# Patient Record
Sex: Female | Born: 1945 | Race: White | Hispanic: No | Smoking: Current every day smoker
Health system: Southern US, Community
[De-identification: ages and names within clinical notes are randomized; demographics above are authoritative.]

## PROBLEM LIST (undated history)

## (undated) DIAGNOSIS — I252 Old myocardial infarction: Secondary | ICD-10-CM

## (undated) DIAGNOSIS — R0989 Other specified symptoms and signs involving the circulatory and respiratory systems: Secondary | ICD-10-CM

## (undated) DIAGNOSIS — K219 Gastro-esophageal reflux disease without esophagitis: Secondary | ICD-10-CM

## (undated) DIAGNOSIS — F411 Generalized anxiety disorder: Secondary | ICD-10-CM

## (undated) DIAGNOSIS — I350 Nonrheumatic aortic (valve) stenosis: Secondary | ICD-10-CM

## (undated) DIAGNOSIS — E782 Mixed hyperlipidemia: Secondary | ICD-10-CM

## (undated) DIAGNOSIS — Z955 Presence of coronary angioplasty implant and graft: Secondary | ICD-10-CM

## (undated) DIAGNOSIS — I10 Essential (primary) hypertension: Secondary | ICD-10-CM

## (undated) DIAGNOSIS — I2510 Atherosclerotic heart disease of native coronary artery without angina pectoris: Secondary | ICD-10-CM

## (undated) HISTORY — PX: LAPAROSCOPIC OOPHERECTOMY: SHX6507

## (undated) HISTORY — DX: Other specified symptoms and signs involving the circulatory and respiratory systems: R09.89

## (undated) HISTORY — DX: Generalized anxiety disorder: F41.1

## (undated) HISTORY — DX: Mixed hyperlipidemia: E78.2

## (undated) HISTORY — DX: Nonrheumatic aortic (valve) stenosis: I35.0

## (undated) HISTORY — DX: Atherosclerotic heart disease of native coronary artery without angina pectoris: I25.2

## (undated) HISTORY — DX: Gastro-esophageal reflux disease without esophagitis: K21.9

## (undated) HISTORY — DX: Atherosclerotic heart disease of native coronary artery without angina pectoris: I25.10

## (undated) HISTORY — DX: Essential (primary) hypertension: I10

## (undated) HISTORY — PX: PERCUTANEOUS CORONARY STENT INTERVENTION (PCI-S): SHX6016

## (undated) HISTORY — DX: Presence of coronary angioplasty implant and graft: Z95.5

---

## 1986-09-29 HISTORY — PX: ABDOMINAL HYSTERECTOMY: SHX81

## 1990-09-29 HISTORY — PX: LAPAROSCOPIC GASTROTOMY W/ REPAIR OF ULCER: SUR772

## 1995-09-30 DIAGNOSIS — I219 Acute myocardial infarction, unspecified: Secondary | ICD-10-CM

## 1995-09-30 HISTORY — DX: Acute myocardial infarction, unspecified: I21.9

## 1998-02-07 ENCOUNTER — Ambulatory Visit (HOSPITAL_COMMUNITY): Admission: RE | Admit: 1998-02-07 | Discharge: 1998-02-07 | Payer: Self-pay | Admitting: Cardiology

## 1999-10-01 ENCOUNTER — Encounter: Admission: RE | Admit: 1999-10-01 | Discharge: 1999-10-01 | Payer: Self-pay | Admitting: Cardiology

## 1999-10-01 ENCOUNTER — Encounter: Payer: Self-pay | Admitting: Cardiology

## 1999-10-11 ENCOUNTER — Ambulatory Visit (HOSPITAL_COMMUNITY): Admission: RE | Admit: 1999-10-11 | Discharge: 1999-10-12 | Payer: Self-pay | Admitting: Cardiology

## 2002-06-09 ENCOUNTER — Encounter: Payer: Self-pay | Admitting: Specialist

## 2002-06-09 ENCOUNTER — Ambulatory Visit (HOSPITAL_COMMUNITY): Admission: RE | Admit: 2002-06-09 | Discharge: 2002-06-09 | Payer: Self-pay | Admitting: Specialist

## 2006-08-26 ENCOUNTER — Encounter (HOSPITAL_COMMUNITY): Admission: RE | Admit: 2006-08-26 | Discharge: 2006-08-27 | Payer: Self-pay | Admitting: Cardiology

## 2006-09-11 ENCOUNTER — Ambulatory Visit (HOSPITAL_COMMUNITY): Admission: RE | Admit: 2006-09-11 | Discharge: 2006-09-11 | Payer: Self-pay | Admitting: Cardiology

## 2006-09-11 ENCOUNTER — Ambulatory Visit: Admission: RE | Admit: 2006-09-11 | Discharge: 2006-09-11 | Payer: Self-pay | Admitting: Cardiology

## 2008-11-24 ENCOUNTER — Emergency Department (HOSPITAL_COMMUNITY): Admission: EM | Admit: 2008-11-24 | Discharge: 2008-11-25 | Payer: Self-pay | Admitting: Emergency Medicine

## 2009-09-17 ENCOUNTER — Ambulatory Visit (HOSPITAL_COMMUNITY): Admission: RE | Admit: 2009-09-17 | Discharge: 2009-09-17 | Payer: Self-pay | Admitting: Otolaryngology

## 2010-12-30 LAB — CBC
HCT: 43.4 % (ref 36.0–46.0)
MCHC: 34.2 g/dL (ref 30.0–36.0)
MCV: 94 fL (ref 78.0–100.0)
Platelets: 317 10*3/uL (ref 150–400)
RDW: 14.4 % (ref 11.5–15.5)

## 2011-01-14 LAB — URINALYSIS, ROUTINE W REFLEX MICROSCOPIC
Bilirubin Urine: NEGATIVE
Ketones, ur: NEGATIVE mg/dL
Nitrite: NEGATIVE
Protein, ur: NEGATIVE mg/dL
Urobilinogen, UA: 0.2 mg/dL (ref 0.0–1.0)

## 2011-01-14 LAB — POCT I-STAT, CHEM 8
BUN: 15 mg/dL (ref 6–23)
Calcium, Ion: 1.09 mmol/L — ABNORMAL LOW (ref 1.12–1.32)
Chloride: 106 meq/L (ref 96–112)
Creatinine, Ser: 0.8 mg/dL (ref 0.4–1.2)
Glucose, Bld: 107 mg/dL — ABNORMAL HIGH (ref 70–99)
HCT: 39 % (ref 36.0–46.0)
Hemoglobin: 13.3 g/dL (ref 12.0–15.0)
Potassium: 3.6 meq/L (ref 3.5–5.1)
Sodium: 140 meq/L (ref 135–145)
TCO2: 25 mmol/L (ref 0–100)

## 2011-01-14 LAB — URINE MICROSCOPIC-ADD ON

## 2011-01-14 LAB — CARBOXYHEMOGLOBIN
Carboxyhemoglobin: 5.9 % (ref 0.5–1.5)
O2 Saturation: 93.8 %
Total hemoglobin: 13 g/dL (ref 12.5–16.0)
Total oxygen content: 15.8 mL/dL (ref 15.0–23.0)

## 2011-02-14 NOTE — Cardiovascular Report (Signed)
NAMEROCHEL, PRIVETT NO.:  [id number]   MEDICAL RECORD NO.:  [id number]          PATIENT TYPE:  OIB   LOCATION:  2899                         FACILITY:  [facility]   PHYSICIAN:  Madaline Savage, M.D.DATE OF BIRTH:  11/05/1945   DATE OF PROCEDURE:  09/11/2006  DATE OF DISCHARGE:  09/11/2006                            CARDIAC CATHETERIZATION   __________  Ostial diagonal branch and also in mid-LAD.  There is an abrupt change  in vessel diameter between the proximal LAD and the mid-LAD, but no  lesions are noted.  Both stents are widely patent in the mid-LAD and in  the ostial diagonal and proximal diagonal stents.   The circumflex coronary artery is dominant.  It contains a radio-opaque  stent midway down the vessel and it is widely patent at the stent site  with good distal runoff.  No new lesions are seen in the circumflex,  which anatomically consists of a first obtuse marginal branch followed  by two posterolateral branches more distally.   Right coronary artery is a small and nondominant vessel, which had  previously had angioplasty and I see no residual stenosis in the  proximal RCA.  The left ventricular angiogram shows good contractility  of all wall segments with ejection fraction estimate of 60%.  No mitral  regurgitation or LV thrombus noted.  Abdominal aortography was performed  because the patient is hypertensive.  Both renal arteries are single and  normal.  No abdominal aortic pathology is seen.   FINAL IMPRESSIONS:  1. Recent chest pain and abnormal stress test showing anteroapical      ischemia.  2. Normal left ventricular systolic function.  3. Coronary artery disease status post multivessel stenting.      a.     Patent mid-left anterior descending stent deployed 1996,       which is a Cook stent.      b.     Patent T-stent to diagonal interfacing with the mid-left       anterior descending stent in a T-like fashion, widely patent with  good distal runoff.      c.     Patent distal circumflex stent.      d.     Patent angioplasty site to the proximal right circumflex       artery.      e.     Normal left ventricular systolic function.  4. Normal renal arteries.  5. Normal abdominal aorta.   PLAN:  The patient will be reassured.  She will be a candidate for  discharge in approximately four hours and will be followed up either in  [place] or in [place] for followup.           ______________________________  Madaline Savage, M.D.     WHG/MEDQ  D:  09/11/2006  T:  09/12/2006  Job:  [id number]   cc:   Madelin Rear. Sherwood Gambler, MD  [facility]  [facility] Medical Records

## 2011-02-14 NOTE — Cardiovascular Report (Signed)
[place]  Patient:    Jenna Allen                       MRN: [id number] Proc. Date: 10/11/99 Adm. Date:  [id number] Attending:  Ophelia Shoulder CC:         Cardiac Catheterization Laboratory             Madaline Savage, M.D.                        Cardiac Catheterization  PROCEDURES PERFORMED: 1. Intracoronary artery stenting of the proximal posterior descending branch of the    left circumflex dominant coronary artery. 2. Percutaneous transluminal coronary angioplasty of the proximal nondominant right    coronary artery. 3. Selective coronary angiography by Judkins technique for retrograde left heart    catheterization. 4. Left ventricular angiography.  COMPLICATIONS:  None.  ENTRY SITE:  Right femoral.  DYE USED:  Omnipaque for diagnostic catheterization and Hexabrix for interventional procedures.  MEDICATIONS GIVEN:  Versed, Fentanyl, ReoPro, and heparin.  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  She had a complex interventional procedure involving the proximal and mid LAD and the ostium of a right diagonal branch. ll three sites were stented in a "Y" configuration with subsequently negative Cardiolite studies.  The patient has high cholesterol, obesity, smokes, and has  recently had some chest pain.  Todays outpatient catheterization is performed electively.  RESULTS:  PRESSURES:  The left ventricular pressure was 125/23, central aortic pressure 125/75 with a mean of 95.  No aortic valve gradient was noted.  ANGIOGRAPHIC RESULTS: 1. The left main coronary artery is normal.  There is calcification involving the    mid segment of the circumflex, which is a dominant vessel, and also in the    proximal LAD.  2. The left anterior descending artery at the complex stent site is remarkably    good-looking.  There is some ectasia in  the proximal LAD at its stent site, ut    there is wide patency of the area.  The mid LAD, likewise, is widely patent.  The ostial portion of the first diagonal is also patent with at most a 20%    ostial stenosis.  The distal runoff in both the diagonal and the LAD is    excellent.  3. The left circumflex is a dominant vessel.  The first obtuse marginal branch    bifurcates and is tortuous and is normal.  The mid portion of the circumflex  contains 40% to 50% luminal irregularities of a diffuse nature.  The proximal    posterior descending branch of the circumflex contains a 95% discrete lesion    and also a short segmental stenosis approaching 75%.  Distal runoff in this    vessel is fairly good.  4. The right coronary artery is small and nondominant.  There is a 99% stenosis in    the proximal portion of the vessel just before an acute marginal branch.  LEFT VENTRICULOGRAPHY:  The left ventricle shows anterolateral wall hypokinesis of a mild degree.  Ejection fraction is estimated at 50% to 55%.  No mitral regurgitation or LV thrombus is seen.  INTERVENTIONAL PROCEDURES:  PROCEDURE #1:  This procedure was a stent procedure with predilatation with balloon angioplasty to the proximal portion of the PDA.  It was  accomplished using a 7-French Voda 3.5 guide catheter, a Patriot wire, and a predilatation balloon which was a 2.5 x 15 mm CrossSail balloon.  The stent used was a 2.5 x 13 mm Tetra stent inflated to 12 atm corresponding to a transluminal diameter of approximately 2.7. The result was excellent with reduction of that 90% stenosis to 0% with TIMI-3 class distal flow.  PROCEDURE #2:  This intervention involved the proximal nondominant right coronary artery.  It was 99% stenosed.  We used a 6-French guide catheter with sideholes in a right Judkins configuration.  A Patriot wire was used.  I dilated first with  1.5 mm x 20 mm CrossSail balloon and inflated to 8 atm of  pressure.  There was  residual stenosis of approximately 60%.  The second balloon used was a 2.0 x 20 mm Adante balloon.  With this, I was able to dilate up to 3 atm to 4 atm safely, corresponding to an estimated transluminal diameter of 1.9.  There was a 40% residual stenosis.  Because the lesion appeared elastic, I was afraid to more aggressively dilate for fear of causing dissection, so I stopped at that point.  The 99% stenosis was reduced to 40%.  There was TIMI-3 class distal flow.  FINAL DIAGNOSES: 1. Multivessel coronary artery disease.      a. Status post mid and proximal left anterior descending artery stenting with         patency of stents.      b. Ostial diagonal stenosis, 20% at stent site.      c. Right coronary artery, proximal 99%.      d. Mid circumflex 50% stenosis.      e. There was 90% proximal posterior descending branch stenosis of dominant         left circumflex coronary artery. 2. Well preserved left ventricular function status post anterior myocardial    infarction. 3. Successful stenting of the proximal posterior descending artery with reduction    of a 90% lesion to 0%. 4. Successful percutaneous transluminal coronary angioplasty of the proximal right    coronary artery with reduction of a 99% stenosis to 40%.  PLAN:  Continue medical therapy.  Continue antilipid therapy.  Continue attempts to reduce weight and to stop smoking. DD:  10/11/99 TD:  10/11/99 Job: [id number]

## 2012-07-21 ENCOUNTER — Other Ambulatory Visit (HOSPITAL_COMMUNITY): Payer: Self-pay | Admitting: Family Medicine

## 2012-07-21 DIAGNOSIS — Z139 Encounter for screening, unspecified: Secondary | ICD-10-CM

## 2012-07-27 ENCOUNTER — Ambulatory Visit (HOSPITAL_COMMUNITY)
Admission: RE | Admit: 2012-07-27 | Discharge: 2012-07-27 | Disposition: A | Discharge status: Home / Self Care | Payer: Medicare Other | Source: Ambulatory Visit | Attending: Family Medicine | Admitting: Family Medicine

## 2012-07-27 ENCOUNTER — Ambulatory Visit (HOSPITAL_COMMUNITY): Payer: Self-pay

## 2012-07-27 DIAGNOSIS — Z1231 Encounter for screening mammogram for malignant neoplasm of breast: Secondary | ICD-10-CM | POA: Insufficient documentation

## 2012-07-27 DIAGNOSIS — Z139 Encounter for screening, unspecified: Secondary | ICD-10-CM

## 2013-04-15 ENCOUNTER — Other Ambulatory Visit (HOSPITAL_COMMUNITY): Payer: Self-pay | Admitting: Nurse Practitioner

## 2013-04-15 DIAGNOSIS — M810 Age-related osteoporosis without current pathological fracture: Secondary | ICD-10-CM

## 2013-04-20 ENCOUNTER — Other Ambulatory Visit (HOSPITAL_COMMUNITY): Payer: Medicare Other

## 2013-04-21 ENCOUNTER — Ambulatory Visit (HOSPITAL_COMMUNITY)
Admission: RE | Admit: 2013-04-21 | Discharge: 2013-04-21 | Disposition: A | Discharge status: Home / Self Care | Payer: Medicare Other | Source: Ambulatory Visit | Attending: Nurse Practitioner | Admitting: Nurse Practitioner

## 2013-04-21 DIAGNOSIS — M949 Disorder of cartilage, unspecified: Secondary | ICD-10-CM | POA: Insufficient documentation

## 2013-04-21 DIAGNOSIS — M810 Age-related osteoporosis without current pathological fracture: Secondary | ICD-10-CM

## 2013-04-21 DIAGNOSIS — M899 Disorder of bone, unspecified: Secondary | ICD-10-CM | POA: Insufficient documentation

## 2013-05-02 ENCOUNTER — Other Ambulatory Visit: Payer: Self-pay | Admitting: Cardiology

## 2013-05-03 NOTE — Telephone Encounter (Signed)
Rx was sent to pharmacy electronically. 

## 2013-06-03 ENCOUNTER — Other Ambulatory Visit: Payer: Self-pay | Admitting: Cardiology

## 2013-06-07 NOTE — Telephone Encounter (Signed)
Rx was sent to pharmacy electronically. 

## 2013-08-04 ENCOUNTER — Other Ambulatory Visit (HOSPITAL_COMMUNITY): Payer: Self-pay | Admitting: Physician Assistant

## 2013-08-04 DIAGNOSIS — Z139 Encounter for screening, unspecified: Secondary | ICD-10-CM

## 2013-08-11 ENCOUNTER — Ambulatory Visit (HOSPITAL_COMMUNITY)
Admission: RE | Admit: 2013-08-11 | Discharge: 2013-08-11 | Disposition: A | Discharge status: Home / Self Care | Payer: Medicare Other | Source: Ambulatory Visit | Attending: Physician Assistant | Admitting: Physician Assistant

## 2013-08-11 ENCOUNTER — Ambulatory Visit (HOSPITAL_COMMUNITY): Payer: Medicare Other

## 2013-08-11 DIAGNOSIS — Z139 Encounter for screening, unspecified: Secondary | ICD-10-CM

## 2013-08-11 DIAGNOSIS — Z1231 Encounter for screening mammogram for malignant neoplasm of breast: Secondary | ICD-10-CM | POA: Insufficient documentation

## 2014-12-18 ENCOUNTER — Other Ambulatory Visit (HOSPITAL_COMMUNITY): Payer: Self-pay | Admitting: Physician Assistant

## 2014-12-18 DIAGNOSIS — Z1239 Encounter for other screening for malignant neoplasm of breast: Secondary | ICD-10-CM

## 2014-12-20 ENCOUNTER — Ambulatory Visit (HOSPITAL_COMMUNITY): Payer: Self-pay

## 2019-11-13 ENCOUNTER — Ambulatory Visit: Payer: Medicare Other | Attending: Internal Medicine

## 2019-11-13 DIAGNOSIS — Z23 Encounter for immunization: Secondary | ICD-10-CM | POA: Insufficient documentation

## 2019-11-13 NOTE — Progress Notes (Signed)
   Covid-19 Vaccination Clinic  Name:  Jenna Allen    MRN: [id number] DOB: [birth year]  11/13/2019  Jenna Allen was observed post Covid-19 immunization for 15 minutes without incidence. She was provided with Vaccine Information Sheet and instruction to access the V-Safe system.   Jenna Allen was instructed to call 911 with any severe reactions post vaccine: Marland Kitchen Difficulty breathing  . Swelling of your face and throat  . A fast heartbeat  . A bad rash all over your body  . Dizziness and weakness    Immunizations Administered    Name Date Dose VIS Date Route   Pfizer COVID-19 Vaccine 11/13/2019 10:10 AM 0.3 mL 09/09/2019 Intramuscular   Manufacturer: [employer], [employer]   Lot: [id number]   NDC: [address]-8

## 2019-12-06 ENCOUNTER — Ambulatory Visit: Payer: Medicare Other | Attending: Internal Medicine

## 2019-12-06 DIAGNOSIS — Z23 Encounter for immunization: Secondary | ICD-10-CM | POA: Insufficient documentation

## 2019-12-06 NOTE — Progress Notes (Signed)
   Covid-19 Vaccination Clinic  Name:  Jenna Allen    MRN: [id number] DOB: 1945/11/17  12/06/2019  Ms. [name] was observed post Covid-19 immunization for 15 minutes without incident. She was provided with Vaccine Information Sheet and instruction to access the V-Safe system.   Ms. [name] was instructed to call 911 with any severe reactions post vaccine: Marland Kitchen Difficulty breathing  . Swelling of face and throat  . A fast heartbeat  . A bad rash all over body  . Dizziness and weakness   Immunizations Administered    Name Date Dose VIS Date Route   Pfizer COVID-19 Vaccine 12/06/2019 12:59 PM 0.3 mL 09/09/2019 Intramuscular   Manufacturer: [employer], [employer]   Lot: [id number]   NDC: [address]-8

## 2020-12-21 ENCOUNTER — Ambulatory Visit (INDEPENDENT_AMBULATORY_CARE_PROVIDER_SITE_OTHER): Payer: Medicare Other | Admitting: Internal Medicine

## 2020-12-21 ENCOUNTER — Other Ambulatory Visit: Payer: Self-pay

## 2020-12-21 ENCOUNTER — Encounter: Payer: Self-pay | Admitting: Internal Medicine

## 2020-12-21 DIAGNOSIS — F17210 Nicotine dependence, cigarettes, uncomplicated: Secondary | ICD-10-CM | POA: Diagnosis not present

## 2020-12-21 DIAGNOSIS — I10 Essential (primary) hypertension: Secondary | ICD-10-CM | POA: Diagnosis not present

## 2020-12-21 DIAGNOSIS — R058 Other specified cough: Secondary | ICD-10-CM | POA: Diagnosis not present

## 2020-12-21 MED ORDER — IRBESARTAN 300 MG PO TABS: 300.0000 mg | ORAL_TABLET | Freq: Every day | ORAL | 11 refills | Status: DC

## 2020-12-21 MED ORDER — OMEPRAZOLE 40 MG PO CPDR: DELAYED_RELEASE_CAPSULE | ORAL | Status: AC

## 2020-12-21 NOTE — Patient Instructions (Addendum)
Change prilosec to Take 30- 60 min before your first and last meals of the day   Stop lisinopril   Start avapro (ibesartan) 300 mg each and break in half if too strong   Please schedule a follow up office visit in 2 weeks, sooner if needed  with all medications /inhalers/ solutions in hand so we can verify exactly what you are taking. This includes all medications from all doctors and over the counters

## 2020-12-21 NOTE — Assessment & Plan Note (Signed)
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   These are the same pts (now being labeled as having "irritable larynx syndrome" by some cough centers) who not infrequently have a history of having failed to tolerate ace inhibitors,  dry powder inhalers or biphosphonates or report having atypical/extraesophageal reflux symptoms that don't respond to standard doses of PPI  and are easily confused as having aecopd or asthma flares by even experienced allergists/ pulmonologists (myself included).   rec d/c acei and max ppi and f/u in 2 weeks

## 2020-12-21 NOTE — Progress Notes (Signed)
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 08/2019 but "no better throat spasms"  Jan 2022 Covid pna   referred to pulmonary clinic in [place]  12/21/2020 by Dr Lauretta Grill NP for sob   History of Present Illness  12/21/2020  Pulmonary/ 1st office eval/ Jenna Allen / [facility] Office  Chief Complaint  Patient presents with  . Consult    Productive cough with clear phlegm, shortness of breath "when I get upset or walk long distance"  Dyspnea:  Maybe 3 aisles  Cough: severe when exposed to groceries or strong smells  Sleep: bed is flat one pillow does  SABA use:  None   No obvious day to day or daytime variability or assoc excess/ purulent sputum or mucus plugs or hemoptysis or cp or chest tightness, subjective wheeze or overt sinus or hb symptoms.   Sleeping  without nocturnal  or early am exacerbation  of respiratory  c/o's or need for noct saba. Also denies any obvious fluctuation of symptoms with weather or environmental changes or other aggravating or alleviating factors except as outlined above   No unusual exposure hx or h/o childhood pna/ asthma or knowledge of premature birth.  Current Allergies, Complete Past Medical History, Past Surgical History, Family History, and Social History were reviewed in [employer] record.  ROS  The following are not active complaints unless bolded Hoarseness, sore throat, dysphagia, dental problems, itching, sneezing,  nasal congestion or discharge of excess mucus or purulent secretions, ear ache,   fever, chills, sweats, unintended wt loss or wt gain, classically pleuritic or exertional cp,  orthopnea pnd or arm/hand swelling  or leg swelling, presyncope, palpitations, abdominal pain, anorexia, nausea, vomiting, diarrhea  or  change in bowel habits or change in bladder habits, change in stools or change in urine, dysuria, hematuria,  rash, arthralgias, visual complaints, headache, numbness, weakness or ataxia or problems with walking or coordination,  change in mood or  memory.           History reviewed. No pertinent past medical history.  Outpatient Medications Prior to Visit  Medication Sig Dispense Refill  . aspirin EC 81 MG tablet Take 81 mg by mouth daily. Swallow whole.    Marland Kitchen atorvastatin (LIPITOR) 80 MG tablet Take 80 mg by mouth daily.    . carvedilol (COREG) 3.125 MG tablet Take 3.125 mg by mouth 2 (two) times daily.    . clindamycin (CLINDAGEL) 1 % gel Apply topically 2 (two) times daily as needed.    . ergocalciferol (VITAMIN D2) 1.25 MG (50000 UT) capsule Take 50,000 Units by mouth once a week.    . escitalopram (LEXAPRO) 20 MG tablet Take 20 mg by mouth daily.    . fluticasone (FLONASE) 50 MCG/ACT nasal spray Place into both nostrils daily.    . furosemide (LASIX) 20 MG tablet Take 20 mg by mouth daily as needed.    . isosorbide mononitrate (IMDUR) 30 MG 24 hr tablet Take 30 mg by mouth daily.    Marland Kitchen levocetirizine (XYZAL) 5 MG tablet Take 5 mg by mouth every evening.    Marland Kitchen lisinopril (ZESTRIL) 10 MG tablet Take 10 mg by mouth daily. Takes 2 tablets to equal 20mg     . LORazepam (ATIVAN) 0.5 MG  tablet Take 0.5 mg by mouth at bedtime.    Marland Kitchen omeprazole (PRILOSEC) 40 MG capsule Take 40 mg by mouth daily.    . valACYclovir (VALTREX) 1000 MG tablet Take 1,000 mg by mouth 2 (two) times daily.    . pravastatin (PRAVACHOL) 40 MG tablet TAKE ONE TABLET BY MOUTH AT BEDTIME. PATIENT NEEDS TO CALL THE OFFICE AND SCHEDULE AND APPOINTMENT. 15 tablet 0   No facility-administered medications prior to visit.     Objective:     BP (!) 180/98 (BP Location: Left Arm, Cuff Size: Normal)   Pulse 61   Temp (!) 97 F (36.1 C) (Other (Comment)) Comment (Src): wrist  Ht 5\' 5"  (1.651 m)   Wt 235 lb (106.6 kg)   SpO2 98%  Comment: Room air  BMI 39.11 kg/m   SpO2: 98 % (Room air)  amb obese hoarse wf  classic pseudowheezing    HEENT : pt wearing mask not removed for exam due to covid -19 concerns.    NECK :  without JVD/Nodes/TM/ nl carotid upstrokes bilaterally   LUNGS: no acc muscle use,  Nl contour chest which is clear to A and P bilaterally without cough on insp or exp maneuvers   CV:  RRR  no s3 or murmur or increase in P2, and no edema   ABD: Obese  soft and nontender with nl inspiratory excursion in the supine position. No bruits or organomegaly appreciated, bowel sounds nl  MS:  Nl gait/ ext warm without deformities, calf tenderness, cyanosis or clubbing No obvious joint restrictions   SKIN: warm and dry without lesions    NEURO:  alert, approp, nl sensorium with  no motor or cerebellar deficits apparent.     I personally reviewed images and agree with radiology impression as follows:   Chest CTa  11/15/20  1. No acute pulmonary embolus.  2. Lungs are clear.       Assessment   No problem-specific Assessment & Plan notes found for this encounter.     11/17/20, MD 12/21/2020

## 2020-12-21 NOTE — Assessment & Plan Note (Signed)
Counseled re importance of smoking cessation but did not meet time criteria for separate billing            Each maintenance medication was reviewed in detail including emphasizing most importantly the difference between maintenance and prns and under what circumstances the prns are to be triggered using an action plan format where appropriate.  Total time for H and P, chart review, counseling,   and generating customized AVS unique to this initial office visit / same day charting = 55 min

## 2020-12-21 NOTE — Assessment & Plan Note (Signed)
D/c acei 12/21/2020 due to UACS   In the best review of chronic cough to date ( NEJM 2016 375 [id number]) ,  ACEi are now felt to cause cough in up to  20% of pts which is a 4 fold increase from previous reports and does not include the variety of non-specific complaints we see in pulmonary clinic in pts on ACEi but previously attributed to another dx like  Copd/asthma and  include PNDS, throat and chest congestion, "bronchitis", unexplained dyspnea and  "strangling" or throat spasm sensations, and hoarseness, but also  atypical /refractory GERD symptoms like dysphagia and "bad heartburn"   The only way I know  to prove this is not an "ACEi Case" is a trial off ACEi x a minimum of 6 weeks then regroup.   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

## 2021-01-01 ENCOUNTER — Ambulatory Visit (INDEPENDENT_AMBULATORY_CARE_PROVIDER_SITE_OTHER): Payer: Medicare Other | Admitting: Internal Medicine

## 2021-01-01 ENCOUNTER — Other Ambulatory Visit: Payer: Self-pay

## 2021-01-01 ENCOUNTER — Telehealth: Payer: Self-pay | Admitting: *Deleted

## 2021-01-01 ENCOUNTER — Encounter: Payer: Self-pay | Admitting: Internal Medicine

## 2021-01-01 DIAGNOSIS — F17210 Nicotine dependence, cigarettes, uncomplicated: Secondary | ICD-10-CM

## 2021-01-01 DIAGNOSIS — R058 Other specified cough: Secondary | ICD-10-CM

## 2021-01-01 DIAGNOSIS — I10 Essential (primary) hypertension: Secondary | ICD-10-CM

## 2021-01-01 MED ORDER — PREDNISONE 10 MG PO TABS: ORAL_TABLET | ORAL | 0 refills | Status: DC

## 2021-01-01 MED ORDER — FAMOTIDINE 40 MG PO TABS: 40.0000 mg | ORAL_TABLET | Freq: Every day | ORAL | 2 refills | Status: DC

## 2021-01-01 NOTE — Telephone Encounter (Signed)
-----   Message from Nyoka Cowden, MD sent at 01/01/2021  3:33 PM EDT ----- Needs pfts on return

## 2021-01-01 NOTE — Assessment & Plan Note (Signed)
Counseled re importance of smoking cessation but did not meet time criteria for separate billing           Each maintenance medication was reviewed in detail including emphasizing most importantly the difference between maintenance and prns and under what circumstances the prns are to be triggered using an action plan format where appropriate.  Total time for H and P, chart review, counseling,   and generating customized AVS unique to this office visit / same day charting = 25 min

## 2021-01-01 NOTE — Assessment & Plan Note (Addendum)
Onset was 2016 while on ACEi - D/C ACEi  12/21/2020 > improved until pollen started   C/w rhinitis/ pnds allergic mech and already on ppi and off acei x 2 weeks so rec  Stop all smoking mucinex dm  gerd diet/ bed blocks  F/u in 6 weeks with pfts

## 2021-01-01 NOTE — Patient Instructions (Addendum)
For cough > mucinex dm 1200 mg every 12 hours as needed   Prednisone 10 mg take  4 each am x 2 days,   2 each am x 2 days,  1 each am x 2 days and stop   prilosec 40 mg Take 30- 60 min before your first and last meals of the day and pepcid 20 mg x 2 an hour before bedtime   GERD (REFLUX)  is an extremely common cause of respiratory symptoms just like yours , many times with no obvious heartburn at all.    It can be treated with medication, but also with lifestyle changes including elevation of the head of your bed (ideally with 6-8inch blocks under the headboard of your bed),  Smoking cessation, avoidance of late meals, excessive alcohol, and avoid fatty foods, chocolate, peppermint, colas, red wine, and acidic juices such as orange juice.  NO MINT OR MENTHOL PRODUCTS SO NO COUGH DROPS  USE SUGARLESS CANDY INSTEAD (Jolley ranchers or Stover's or Life Savers) or even ice chips will also do - the key is to swallow to prevent all throat clearing. NO OIL BASED VITAMINS - use powdered substitutes.  Avoid fish oil when coughing.   The key is to stop smoking completely before smoking completely stops you!    Please schedule a follow up office visit in 6 weeks,  PFTs on return

## 2021-01-01 NOTE — Assessment & Plan Note (Signed)
D/c acei 12/21/2020 due to UACS   Although even in retrospect it may not be clear the ACEi contributed to the pt's symptoms,  Pt improved off them and adding them back at this point or in the future would risk confusion in interpretation of non-specific respiratory symptoms to which this patient is prone  ie  Better not to muddy the waters here.   Adequate control on present rx, reviewed in detail with pt > no change in rx needed

## 2021-01-01 NOTE — Telephone Encounter (Signed)
Spoke with the pt  She states okay with having PFT and prefers [facility] for this  Order placed  She is aware RT from [facility] will call her to set this up

## 2021-01-01 NOTE — Progress Notes (Signed)
Jenna Allen, female    DOB: [birth year]    MRN: [id number]   Brief patient profile:  61 yowf active smoker with hbp on ACEi since 1990s with onset around 2016 rhinitis/ cough worse with strong smells rx ent with polyp surgery in [place] ? 08/2019 but "no better throat spasms"  Jan 2022 Covid pna   referred to pulmonary clinic in [place]  12/21/2020 by Dr Lauretta Grill NP for sob   History of Present Illness  12/21/2020  Pulmonary/ 1st office eval/ [facility] Lloyd / [facility]  Chief Complaint  Patient presents with  . Consult    Productive cough with clear phlegm, shortness of breath "when I get upset or walk long distance"  Dyspnea:  Maybe 3 aisles  Cough: severe when exposed to groceries or strong smells  Sleep: bed is flat one pillow does  SABA use:  None  rec Change prilosec to Take 30- 60 min before your first and last meals of the day  Stop lisinopril  Start avapro (ibesartan) 300 mg each and break in half if too strong  Please schedule a follow up office visit in 2 weeks, sooner if needed  with all medications   01/01/2021  f/u ov/[place] office/Jamileth Putzier re: UACS ? ACEi related  Chief Complaint  Patient presents with  . Follow-up    Cough had basically resolved until about 4-5 days ago- relates to pollen. She is coughing up minimal clear sputum. Feels fatigued.   Dyspnea:  Better until 5 day prior to OV   Cough: as usual, flared with pollen  Sleeping: flat bed /one pilow SABA use: none 02: none  Covid status: vax x 2 and infected Lung cancer screening: novant 11/20/20  3 mm only   No obvious day to day or daytime variability or assoc excess/ purulent sputum or mucus plugs or hemoptysis or cp or chest tightness, subjective wheeze or overt sinus or hb symptoms.   Sleeping without nocturnal  or early am exacerbation  of respiratory  c/o's or need for noct saba. Also denies any obvious fluctuation of symptoms with weather or environmental changes or other aggravating  or alleviating factors except as outlined above   No unusual exposure hx or h/o childhood pna/ asthma or knowledge of premature birth.  Current Allergies, Complete Past Medical History, Past Surgical History, Family History, and Social History were reviewed in [employer] record.  ROS  The following are not active complaints unless bolded Hoarseness, sore throat, dysphagia, dental problems, itching, sneezing,  nasal congestion or discharge of excess mucus or purulent secretions, ear ache,   fever, chills, sweats, unintended wt loss or wt gain, classically pleuritic or exertional cp,  orthopnea pnd or arm/hand swelling  or leg swelling, presyncope, palpitations, abdominal pain, anorexia, nausea, vomiting, diarrhea  or change in bowel habits or change in bladder habits, change in stools or change in urine, dysuria, hematuria,  rash, arthralgias, visual complaints, headache, numbness, weakness or ataxia or problems with walking or coordination,  change in mood or  memory.        Current Meds  Medication Sig  . aspirin EC 81 MG tablet Take 81 mg by mouth daily. Swallow whole.  Marland Kitchen atorvastatin (LIPITOR) 80 MG tablet Take 80 mg by mouth daily.  . carvedilol (COREG) 3.125 MG tablet Take 3.125 mg by mouth 2 (two) times daily.  . clindamycin (CLINDAGEL) 1 % gel Apply topically 2 (two) times daily as needed.  . ergocalciferol (VITAMIN D2) 1.25 MG (  50000 UT) capsule Take 50,000 Units by mouth once a week.  . escitalopram (LEXAPRO) 20 MG tablet Take 20 mg by mouth daily.  . famotidine (PEPCID) 20 MG tablet Take 40 mg by mouth at bedtime.  . fluticasone (FLONASE) 50 MCG/ACT nasal spray Place into both nostrils daily.  . furosemide (LASIX) 20 MG tablet Take 20 mg by mouth daily as needed.  Marland Kitchen ibuprofen (ADVIL) 200 MG tablet Take 200 mg by mouth every 6 (six) hours as needed.  . irbesartan (AVAPRO) 300 MG tablet Take 1 tablet (300 mg total) by mouth daily.  . isosorbide mononitrate  (IMDUR) 30 MG 24 hr tablet Take 30 mg by mouth daily.  Marland Kitchen levocetirizine (XYZAL) 5 MG tablet Take 5 mg by mouth every evening.  Marland Kitchen LORazepam (ATIVAN) 0.5 MG tablet Take 0.5 mg by mouth at bedtime.  Marland Kitchen omeprazole (PRILOSEC) 40 MG capsule Take 30- 60 min before your first and last meals of the day  . valACYclovir (VALTREX) 1000 MG tablet Take 1,000 mg by mouth 2 (two) times daily.             Objective:    Wt Readings from Last 3 Encounters:  01/01/21 234 lb 9.6 oz (106.4 kg)  12/21/20 235 lb (106.6 kg)      Vital signs reviewed  01/01/2021  - Note at rest 02 sats  99% on RA   General appearance:    Mod obese amb bf nad/ mod hoarse           HEENT : pt wearing mask not removed for exam due to covid -19 concerns.    NECK :  without JVD/Nodes/TM/ nl carotid upstrokes bilaterally   LUNGS: no acc muscle use,  Nl contour chest which is clear to A and P bilaterally without cough on insp or exp maneuvers   CV:  RRR  no s3 or 2-3/6  Sem or increase in P2, and no edema   ABD:  soft and nontender with nl inspiratory excursion in the supine position. No bruits or organomegaly appreciated, bowel sounds nl  MS:  Nl gait/ ext warm without deformities, calf tenderness, cyanosis or clubbing No obvious joint restrictions   SKIN: warm and dry without lesions    NEURO:  alert, approp, nl sensorium with  no motor or cerebellar deficits apparent.            Assessment   No problem-specific Assessment & Plan notes found for this encounter.     Sandrea Hughs, MD 12/21/2020

## 2021-01-21 ENCOUNTER — Telehealth: Payer: Self-pay | Admitting: Internal Medicine

## 2021-01-21 MED ORDER — PREDNISONE 10 MG PO TABS: 20.0000 mg | ORAL_TABLET | Freq: Every day | ORAL | 0 refills | Status: DC

## 2021-01-21 NOTE — Telephone Encounter (Signed)
Primary Pulmonologist: Dr Sherene Sires Last office visit and with whom: 01/01/2021 What do we see them for (pulmonary problems): Upper airway cough syndrome, essential hypertension, cigarette smoker Last OV assessment/plan:  Onset was 2016 while on ACEi - D/C ACEi  12/21/2020 > improved until pollen started   C/w rhinitis/ pnds allergic mech and already on ppi and off acei x 2 weeks so rec  Stop all smoking mucinex dm  gerd diet/ bed blocks  F/u in 6 weeks with pfts       Assessment & Plan Note by Nyoka Cowden, MD at 01/01/2021 2:57 PM  Author: Nyoka Cowden, MD Author Type: Physician Filed: 01/01/2021 2:57 PM  Note Status: Written Cosign: Cosign Not Required Encounter Date: 01/01/2021  Problem: Cigarette smoker  Editor: Nyoka Cowden, MD (Physician)             Counseled re importance of smoking cessation but did not meet time criteria for separate billing           Each maintenance medication was reviewed in detail including emphasizing most importantly the difference between maintenance and prns and under what circumstances the prns are to be triggered using an action plan format where appropriate.  Total time for H and P, chart review, counseling,   and generating customized AVS unique to this office visit / same day charting = 25 min             Assessment & Plan Note by Nyoka Cowden, MD at 01/01/2021 2:57 PM  Author: Nyoka Cowden, MD Author Type: Physician Filed: 01/01/2021 2:57 PM  Note Status: Written Cosign: Cosign Not Required Encounter Date: 01/01/2021  Problem: Essential hypertension  Editor: Nyoka Cowden, MD (Physician)             D/c acei 12/21/2020 due to UACS   Although even in retrospect it may not be clear the ACEi contributed to the pt's symptoms,  Pt improved off them and adding them back at this point or in the future would risk confusion in interpretation of non-specific respiratory symptoms to which this patient is prone  ie  Better not to muddy  the waters here.   Adequate control on present rx, reviewed in detail with pt > no change in rx needed          Instructions  For cough > mucinex dm 1200 mg every 12 hours as needed   Prednisone 10 mg take  4 each am x 2 days,   2 each am x 2 days,  1 each am x 2 days and stop   prilosec 40 mg Take 30- 60 min before your first and last meals of the day and pepcid 20 mg x 2 an hour before bedtime   GERD (REFLUX)  is an extremely common cause of respiratory symptoms just like yours , many times with no obvious heartburn at all.    It can be treated with medication, but also with lifestyle changes including elevation of the head of your bed (ideally with 6-8inch blocks under the headboard of your bed),  Smoking cessation, avoidance of late meals, excessive alcohol, and avoid fatty foods, chocolate, peppermint, colas, red wine, and acidic juices such as orange juice.  NO MINT OR MENTHOL PRODUCTS SO NO COUGH DROPS  USE SUGARLESS CANDY INSTEAD (Jolley ranchers or Stover's or Life Savers) or even ice chips will also do - the key is to swallow to prevent all throat clearing. NO OIL BASED VITAMINS -  use powdered substitutes.  Avoid fish oil when coughing.   The key is to stop smoking completely before smoking completely stops you!    Please schedule a follow up office visit in 6 weeks,  PFTs on return         Reason for call: Pt states she is having trouble breathing last 3 to 4 days. Pt went outside and started having an "attack" and at the grocery store as well.Pt states she is coughing a lot with clear phlegm, nose continuously running(clear). No fever. Patient states breathing is better when gets where there is air conditioner.   Pharm- [facility] in [place]   Dr Delton Coombes please advise since Dr Sherene Sires is unavailable.  Allergies  Allergen Reactions  . Erythromycin Other (See Comments)    Jaundice  . Rosuvastatin Nausea And Vomiting    Other reaction(s): Abdominal Pain,  Bleeding, Constipation, Cough, Dizziness  . Fosamax [Alendronate] Other (See Comments)    Muscle spasm     Immunization History  Administered Date(s) Administered  . Influenza, Quadrivalent, Recombinant, Inj, Pf 07/27/2020  . PFIZER(Purple Top)SARS-COV-2 Vaccination 11/13/2019, 12/06/2019  . Pneumococcal Conjugate-13 09/10/2017  . Pneumococcal Polysaccharide-23 09/14/2018  . Zoster Recombinat (Shingrix) 07/19/2020

## 2021-01-21 NOTE — Telephone Encounter (Signed)
Called and spoke with patient. She verbalized understanding. Will send prednisone to her pharmacy.   Nothing further needed at time of call.

## 2021-01-21 NOTE — Telephone Encounter (Signed)
OK with treating with pred 20mg  qd x 5 days, then assess response with Dr . Suspect that increased allergy sx are a contributor here.

## 2021-02-11 ENCOUNTER — Encounter: Payer: Self-pay | Admitting: Internal Medicine

## 2021-02-11 ENCOUNTER — Ambulatory Visit (INDEPENDENT_AMBULATORY_CARE_PROVIDER_SITE_OTHER): Payer: Medicare Other | Admitting: Internal Medicine

## 2021-02-11 ENCOUNTER — Other Ambulatory Visit: Payer: Self-pay

## 2021-02-11 DIAGNOSIS — I350 Nonrheumatic aortic (valve) stenosis: Secondary | ICD-10-CM | POA: Diagnosis not present

## 2021-02-11 DIAGNOSIS — R058 Other specified cough: Secondary | ICD-10-CM

## 2021-02-11 DIAGNOSIS — F17210 Nicotine dependence, cigarettes, uncomplicated: Secondary | ICD-10-CM

## 2021-02-11 NOTE — Assessment & Plan Note (Signed)
Counseled re importance of smoking cessation but did not meet time criteria for separate billing    rec PFT's for baseline  Low-dose CT lung cancer screening is recommended for patients who are 50-74 years of age with a 30+ pack-year history of smoking, and who are currently smoking or quit <=15 years ago.  >>> eligible for screening/ defer yearly f/u to PCP at setting of annual exam and use our program if preferred.          Each maintenance medication was reviewed in detail including emphasizing most importantly the difference between maintenance and prns and under what circumstances the prns are to be triggered using an action plan format where appropriate.  Total time for H and P, chart review, counseling, reviewing nd generating customized AVS unique to this final summary f/u  office visit / same day charting  > 30 min

## 2021-02-11 NOTE — Assessment & Plan Note (Signed)
Onset was 2016 while on ACEi - D/C ACEi  12/21/2020 > improved until pollen started > resolved as of 02/11/2021   Would avoid acei in future since ARB  tolerated well and don't add to confusion in interpreting non-specific pulmonary symptoms

## 2021-02-11 NOTE — Progress Notes (Signed)
Jenna Allen, female    DOB: 1945/10/28    MRN: [id number]   Brief patient profile:  24 yowf active smoker with hbp on ACEi since 1990s with onset around 2016 rhinitis/ cough worse with strong smells rx ent with polyp surgery in [place] ? 08/2019 but "no better throat spasms"  Jan 2022 Covid pna   referred to pulmonary clinic in [place]  12/21/2020 by Dr Lauretta Grill NP for sob   History of Present Illness  12/21/2020  Pulmonary/ 1st office eval/ Lynleigh Kovack / [facility]  Chief Complaint  Patient presents with  . Consult    Productive cough with clear phlegm, shortness of breath "when I get upset or walk long distance"  Dyspnea:  Maybe 3 aisles  Cough: severe when exposed to groceries or strong smells  Sleep: bed is flat one pillow does  SABA use:  None  rec Change prilosec to Take 30- 60 min before your first and last meals of the day  Stop lisinopril  Start avapro (ibesartan) 300 mg each and break in half if too strong  Please schedule a follow up office visit in 2 weeks, sooner if needed  with all medications   01/01/2021  f/u ov/[place] office/Azam Gervasi re: UACS ? ACEi related  Chief Complaint  Patient presents with  . Follow-up    Cough had basically resolved until about 4-5 days ago- relates to pollen. She is coughing up minimal clear sputum. Feels fatigued.   Dyspnea:  Better until 5 day prior to OV   Cough: as usual, flared with pollen  Sleeping: flat bed /one pilow SABA use: none 02: none  Covid status: vax x 2 and infected Lung cancer screening: novant 11/20/20  3 mm only rec For cough > mucinex dm 1200 mg every 12 hours as needed  Prednisone 10 mg take  4 each am x 2 days,   2 each am x 2 days,  1 each am x 2 days and stop  prilosec 40 mg Take 30- 60 min before your first and last meals of the day and pepcid 20 mg x 2 an hour before bedtime  GERD  Diet  The key is to stop smoking completely before smoking completely stops you! Please schedule a follow up  office visit in 6 weeks,  PFTs on return    02/11/2021  f/u ov/ office/Charne Mcbrien re:  UACS / active smoker Chief Complaint  Patient presents with  . Follow-up    Cough and SOB have improved some since last visit.   Dyspnea:   Much better now, walks whole grocery store/ no  HC parking  Cough:  Daytime, ? p coreg  Overall much better clear mucus  Sleeping: bed blocks / one pillow does fine  SABA use: none  02: none  Covid status: vax x 2 Lung cancer screening     No obvious day to day or daytime variability or assoc excess/ purulent sputum or mucus plugs or hemoptysis or cp or chest tightness, subjective wheeze or overt sinus or hb symptoms.   Sleeping without nocturnal  or early am exacerbation  of respiratory  c/o's or need for noct saba. Also denies any obvious fluctuation of symptoms with weather or environmental changes or other aggravating or alleviating factors except as outlined above   No unusual exposure hx or h/o childhood pna/ asthma or knowledge of premature birth.  Current Allergies, Complete Past Medical History, Past Surgical History, Family History, and Social History were reviewed in [facility] electronic medical record.  ROS  The following are not active complaints unless bolded Hoarseness, sore throat, dysphagia, dental problems, itching, sneezing,  nasal congestion or discharge of excess mucus or purulent secretions, ear ache,   fever, chills, sweats, unintended wt loss or wt gain, classically pleuritic or exertional cp,  orthopnea pnd or arm/hand swelling  or leg swelling, presyncope, palpitations, abdominal pain, anorexia, nausea, vomiting, diarrhea  or change in bowel habits or change in bladder habits, change in stools or change in urine, dysuria, hematuria,  rash, arthralgias, visual complaints, headache, numbness, weakness or ataxia or problems with walking or coordination,  change in mood or  memory.        Current Meds  Medication Sig  . aspirin EC  81 MG tablet Take 81 mg by mouth daily. Swallow whole.  Marland Kitchen atorvastatin (LIPITOR) 80 MG tablet Take 80 mg by mouth daily.  . carvedilol (COREG) 3.125 MG tablet Take 3.125 mg by mouth 2 (two) times daily.  . clindamycin (CLINDAGEL) 1 % gel Apply topically 2 (two) times daily as needed.  . ergocalciferol (VITAMIN D2) 1.25 MG (50000 UT) capsule Take 50,000 Units by mouth once a week.  . escitalopram (LEXAPRO) 20 MG tablet Take 20 mg by mouth daily.  . famotidine (PEPCID) 40 MG tablet Take 1 tablet (40 mg total) by mouth at bedtime.  . fluticasone (FLONASE) 50 MCG/ACT nasal spray Place into both nostrils daily.  . furosemide (LASIX) 20 MG tablet Take 20 mg by mouth daily as needed.  Marland Kitchen ibuprofen (ADVIL) 200 MG tablet Take 200 mg by mouth every 6 (six) hours as needed.  . irbesartan (AVAPRO) 300 MG tablet Take 1 tablet (300 mg total) by mouth daily.  . isosorbide mononitrate (IMDUR) 30 MG 24 hr tablet Take 30 mg by mouth daily.  Marland Kitchen LORazepam (ATIVAN) 0.5 MG tablet Take 0.5 mg by mouth at bedtime.  Marland Kitchen omeprazole (PRILOSEC) 40 MG capsule Take 30- 60 min before your first and last meals of the day  . valACYclovir (VALTREX) 1000 MG tablet Take 1,000 mg by mouth 2 (two) times daily as needed.            Objective:    02/11/2021        235   01/01/21 234 lb 9.6 oz (106.4 kg)  12/21/20 235 lb (106.6 kg)    Vital signs reviewed  02/11/2021  - Note at rest 02 sats  99% on RA   General appearance:    amb mod obese wf nad    HEENT : pt wearing mask not removed for exam due to covid -19 concerns.    NECK :  without JVD/Nodes/TM/ nl carotid upstrokes bilaterally   LUNGS: no acc muscle use,  Nl contour chest which is clear to A and P bilaterally without cough on insp or exp maneuvers   CV:  RRR  no s3 or 2-3/6 sem  or increase in P2, and no edema   ABD:  soft and nontender with nl inspiratory excursion in the supine position. No bruits or organomegaly appreciated, bowel sounds nl  MS:  Nl gait/  ext warm without deformities, calf tenderness, cyanosis or clubbing No obvious joint restrictions   SKIN: warm and dry without lesions    NEURO:  alert, approp, nl sensorium with  no motor or cerebellar deficits apparent.                        Assessment

## 2021-02-11 NOTE — Patient Instructions (Addendum)
Discuss low dose CT chest for lung cancer screening with your Primary care provider   Your coreg (corevidol) may be prone to causing a cough or wheeze so would discuss substituting bisoprolol with him     We will be sure have you scheduled for PFTs and I'll call you the results   The key is to stop smoking completely before smoking completely stops you!   If you are satisfied with your treatment plan,  let your doctor know and he/she can either refill your medications or you can return here when your prescription runs out.     If in any way you are not 100% satisfied,  please tell us.  If 100% better, tell your friends!  Pulmonary follow up is as needed

## 2021-02-11 NOTE — Assessment & Plan Note (Signed)
See echo 12/25/20 in care [facility]:  Moderate AS with elevated LA pressure  Well compensated for now but convinced coreg makes her cough (? Due to asthma)   In the setting of respiratory symptoms of unknown etiology,  It would be preferable to use bystolic, the most beta -1  selective Beta blocker available in sample form, with bisoprolol the most selective generic choice  on the market, at least on a trial basis, to make sure the spillover Beta 2 effects of the less specific Beta blockers are not contributing to this patient's symptoms.   >>> defer to cardiology

## 2021-02-18 ENCOUNTER — Other Ambulatory Visit: Payer: Self-pay

## 2021-02-18 ENCOUNTER — Other Ambulatory Visit (HOSPITAL_COMMUNITY)
Admission: RE | Admit: 2021-02-18 | Discharge: 2021-02-18 | Disposition: A | Discharge status: Home / Self Care | Payer: Medicare Other | Source: Ambulatory Visit | Attending: Internal Medicine | Admitting: Internal Medicine

## 2021-02-18 DIAGNOSIS — Z01812 Encounter for preprocedural laboratory examination: Secondary | ICD-10-CM | POA: Insufficient documentation

## 2021-02-18 DIAGNOSIS — Z20822 Contact with and (suspected) exposure to covid-19: Secondary | ICD-10-CM | POA: Diagnosis not present

## 2021-02-18 LAB — SARS CORONAVIRUS 2 (TAT 6-24 HRS): SARS Coronavirus 2: NEGATIVE

## 2021-02-20 ENCOUNTER — Other Ambulatory Visit: Payer: Self-pay

## 2021-02-20 ENCOUNTER — Ambulatory Visit (HOSPITAL_COMMUNITY)
Admission: RE | Admit: 2021-02-20 | Discharge: 2021-02-20 | Disposition: A | Discharge status: Home / Self Care | Payer: Medicare Other | Source: Ambulatory Visit | Attending: Internal Medicine | Admitting: Internal Medicine

## 2021-02-20 DIAGNOSIS — J988 Other specified respiratory disorders: Secondary | ICD-10-CM | POA: Diagnosis not present

## 2021-02-20 DIAGNOSIS — F17210 Nicotine dependence, cigarettes, uncomplicated: Secondary | ICD-10-CM | POA: Diagnosis not present

## 2021-02-20 DIAGNOSIS — R053 Chronic cough: Secondary | ICD-10-CM | POA: Diagnosis present

## 2021-02-20 DIAGNOSIS — R058 Other specified cough: Secondary | ICD-10-CM

## 2021-02-20 LAB — PULMONARY FUNCTION TEST
DL/VA % pred: 93 %
DL/VA: 3.83 ml/min/mmHg/L
DLCO unc % pred: 60 %
DLCO unc: 12.07 ml/min/mmHg
FEF 25-75 Post: 0.61 L/sec
FEF 25-75 Pre: 0.9 L/sec
FEF2575-%Change-Post: -32 %
FEF2575-%Pred-Post: 33 %
FEF2575-%Pred-Pre: 49 %
FEV1-%Change-Post: -4 %
FEV1-%Pred-Post: 46 %
FEV1-%Pred-Pre: 48 %
FEV1-Post: 1.05 L
FEV1-Pre: 1.1 L
FEV1FVC-%Change-Post: -8 %
FEV1FVC-%Pred-Pre: 100 %
FEV6-%Change-Post: 5 %
FEV6-%Pred-Post: 52 %
FEV6-%Pred-Pre: 49 %
FEV6-Post: 1.5 L
FEV6-Pre: 1.43 L
FEV6FVC-%Change-Post: 0 %
FEV6FVC-%Pred-Post: 103 %
FEV6FVC-%Pred-Pre: 104 %
FVC-%Change-Post: 4 %
FVC-%Pred-Post: 50 %
FVC-%Pred-Pre: 48 %
FVC-Post: 1.52 L
FVC-Pre: 1.45 L
Post FEV1/FVC ratio: 69 %
Post FEV6/FVC ratio: 99 %
Pre FEV1/FVC ratio: 75 %
Pre FEV6/FVC Ratio: 100 %
RV % pred: 141 %
RV: 3.25 L
TLC % pred: 96 %
TLC: 5.03 L

## 2021-02-20 MED ORDER — ALBUTEROL SULFATE (2.5 MG/3ML) 0.083% IN NEBU
2.5000 mg | INHALATION_SOLUTION | Freq: Once | RESPIRATORY_TRACT | Status: AC
  Administered 2021-02-20: 2.5 mg via RESPIRATORY_TRACT

## 2021-02-21 ENCOUNTER — Encounter: Payer: Self-pay | Admitting: *Deleted

## 2021-02-22 ENCOUNTER — Encounter: Payer: Self-pay | Admitting: Internal Medicine

## 2021-02-22 ENCOUNTER — Encounter: Payer: Self-pay | Admitting: *Deleted

## 2021-02-22 DIAGNOSIS — R0600 Dyspnea, unspecified: Secondary | ICD-10-CM | POA: Insufficient documentation

## 2021-02-22 DIAGNOSIS — R0609 Other forms of dyspnea: Secondary | ICD-10-CM | POA: Insufficient documentation

## 2021-02-22 NOTE — Progress Notes (Signed)
Spoke with pt and notified of results per Dr. Wert. Pt verbalized understanding and denied any questions. 

## 2021-02-27 ENCOUNTER — Other Ambulatory Visit: Payer: Self-pay | Admitting: Internal Medicine

## 2021-05-27 ENCOUNTER — Other Ambulatory Visit: Payer: Self-pay | Admitting: Internal Medicine

## 2021-08-19 ENCOUNTER — Other Ambulatory Visit: Payer: Self-pay | Admitting: Internal Medicine

## 2021-10-16 ENCOUNTER — Other Ambulatory Visit: Payer: Self-pay | Admitting: Internal Medicine

## 2021-10-22 ENCOUNTER — Other Ambulatory Visit: Payer: Self-pay | Admitting: Internal Medicine

## 2021-10-28 ENCOUNTER — Other Ambulatory Visit: Payer: Self-pay | Admitting: Internal Medicine

## 2021-11-13 ENCOUNTER — Other Ambulatory Visit: Payer: Self-pay | Admitting: Internal Medicine

## 2021-12-31 ENCOUNTER — Encounter: Payer: Self-pay | Admitting: Internal Medicine

## 2021-12-31 ENCOUNTER — Ambulatory Visit (INDEPENDENT_AMBULATORY_CARE_PROVIDER_SITE_OTHER): Payer: Medicare Other | Admitting: Internal Medicine

## 2021-12-31 DIAGNOSIS — R0609 Other forms of dyspnea: Secondary | ICD-10-CM

## 2021-12-31 DIAGNOSIS — I350 Nonrheumatic aortic (valve) stenosis: Secondary | ICD-10-CM | POA: Diagnosis not present

## 2021-12-31 DIAGNOSIS — R058 Other specified cough: Secondary | ICD-10-CM | POA: Diagnosis not present

## 2021-12-31 DIAGNOSIS — F17210 Nicotine dependence, cigarettes, uncomplicated: Secondary | ICD-10-CM | POA: Diagnosis not present

## 2021-12-31 NOTE — Progress Notes (Signed)
Jenna Allen, female    DOB: 07/11/1946    MRN: [id number] ? ? ?Brief patient profile:  ?41  yowf active smoker with hbp on ACEi since 1990s with onset around 2016 rhinitis/ cough worse with strong smells rx ent with polyp surgery in [place] ? 08/2019 but "no better throat spasms" ? ?Jan 2022 Covid pna  ? ?referred to pulmonary clinic in [place]  12/21/2020 by Dr Lauretta Grill NP for sob  ? ?History of Present Illness  ?12/21/2020  Pulmonary/ 1st office eval/ Sherene Sires / [facility]  ?Chief Complaint  ?Patient presents with  ? Consult  ?  Productive cough with clear phlegm, shortness of breath "when I get upset or walk long distance"  ?Dyspnea:  Maybe 3 aisles  ?Cough: severe when exposed to groceries or strong smells  ?Sleep: bed is flat one pillow does  ?SABA use:  None  ?rec ?Change prilosec to Take 30- 60 min before your first and last meals of the day  ?Stop lisinopril  ?Start avapro (ibesartan) 300 mg each and break in half if too strong  ?Please schedule a follow up office visit in 2 weeks, sooner if needed  with all medications ? ? ?01/01/2021  f/u ov/[place] office/Cristie Mckinney re: UACS ? ACEi related  ?Chief Complaint  ?Patient presents with  ? Follow-up  ?  Cough had basically resolved until about 4-5 days ago- relates to pollen. She is coughing up minimal clear sputum. Feels fatigued.   ?Dyspnea:  Better until 5 day prior to OV   ?Cough: as usual, flared with pollen  ?Sleeping: flat bed /one pilow ?SABA use: none ?02: none  ?Covid status: vax x 2 and infected ?Lung cancer screening: [facility] 11/20/20  3 mm only ?rec ?For cough > mucinex dm 1200 mg every 12 hours as needed  ?Prednisone 10 mg take  4 each am x 2 days,   2 each am x 2 days,  1 each am x 2 days and stop  ?prilosec 40 mg Take 30- 60 min before your first and last meals of the day and pepcid 20 mg x 2 an hour before bedtime  ?GERD  Diet  ?The key is to stop smoking completely before smoking completely stops you! ?Please schedule a follow up  office visit in 6 weeks,  PFTs on return  ? ? ?02/11/2021  f/u ov/[place] office/Apoorva Bugay re:  UACS / active smoker ?Chief Complaint  ?Patient presents with  ? Follow-up  ?  Cough and SOB have improved some since last visit.   ?Dyspnea:   Much better now, walks whole grocery store/ no  HC parking  ?Cough:  Daytime, ? p coreg  Overall much better clear mucus  ?Sleeping: bed blocks / one pillow does fine  ?SABA use: none  ?02: none  ?Covid status: vax x 2 ?Lung cancer screening   ?Rec ?Discuss low dose CT chest for lung cancer screening with your Primary care provider  ?Your coreg (corevidol) may be prone to causing a cough or wheeze so would discuss substituting bisoprolol with him   ?The key is to stop smoking completely before smoking completely stops you! ? ? ? ?12/31/2021  f/u ov/[place] office/Radha Coggins re: doe maint on no resp meds/ still smoking   ?Chief Complaint  ?Patient presents with  ? Follow-up  ?  Patient states she needs a new valve for her heart and cardiology said due to her breathing she wouldn't make it through surgery. Patient feels like breathing is the same  since last visit.   ?Dyspnea:  [employer] gets tired in legs / uses [employer] parking but really not limited much by doe  ?Doesn't go to mb anymore due to legs  ?Can still walk 300 ft slower than others can't do steps  ?MMRC3 = can't walk 100 yards even at a slow pace at a flat grade s stopping due to sob   ?Cough: more than usual x 3 weeks [place]  ?Sleeping: no bed blocks / one pillow  ?SABA use: has proair respiclick not helping  ?02: none  ?Covid status: vax x 2  ?  ? ? ?No obvious day to day or daytime variability or assoc excess/ purulent sputum or mucus plugs or hemoptysis or cp or chest tightness, subjective wheeze or overt sinus or hb symptoms.  ? ?Sleeping  without nocturnal  or early am exacerbation  of respiratory  c/o's or need for noct saba. Also denies any obvious fluctuation of symptoms with weather or  environmental changes or other aggravating or alleviating factors except as outlined above  ? ?No unusual exposure hx or h/o childhood pna/ asthma or knowledge of premature birth. ? ?Current Allergies, Complete Past Medical History, Past Surgical History, Family History, and Social History were reviewed in [facility] electronic medical record. ? ?ROS  The following are not active complaints unless bolded ?Hoarseness, sore throat, dysphagia, dental problems, itching, sneezing,  nasal congestion or discharge of excess mucus or purulent secretions, ear ache,   fever, chills, sweats, unintended wt loss or wt gain, classically pleuritic or exertional cp,  orthopnea pnd or arm/hand swelling  or leg swelling, presyncope, palpitations, abdominal pain, anorexia, nausea, vomiting, diarrhea  or change in bowel habits or change in bladder habits, change in stools or change in urine, dysuria, hematuria,  rash, arthralgias, visual complaints, headache, numbness, weakness or ataxia or problems with walking or coordination,  change in mood or  memory. ?      ? ?Current Meds  ?Medication Sig  ? aspirin EC 81 MG tablet Take 81 mg by mouth daily. Swallow whole.  ? atorvastatin (LIPITOR) 80 MG tablet Take 80 mg by mouth daily.  ? carvedilol (COREG) 3.125 MG tablet Take 3.125 mg by mouth 2 (two) times daily.  ? clindamycin (CLINDAGEL) 1 % gel Apply topically 2 (two) times daily as needed.  ? ergocalciferol (VITAMIN D2) 1.25 MG (50000 UT) capsule Take 50,000 Units by mouth once a week.  ? escitalopram (LEXAPRO) 20 MG tablet Take 20 mg by mouth daily.  ? famotidine (PEPCID) 40 MG tablet Take 1 tablet (40 mg total) by mouth at bedtime.  ? fluticasone (FLONASE) 50 MCG/ACT nasal spray Place into both nostrils daily.  ? furosemide (LASIX) 20 MG tablet Take 20 mg by mouth daily as needed.  ? ibuprofen (ADVIL) 200 MG tablet Take 200 mg by mouth every 6 (six) hours as needed.  ? irbesartan (AVAPRO) 300 MG tablet Take 1 tablet (300 mg total)  by mouth daily.  ? isosorbide mononitrate (IMDUR) 30 MG 24 hr tablet Take 30 mg by mouth daily.  ? LORazepam (ATIVAN) 0.5 MG tablet Take 0.5 mg by mouth at bedtime.  ? omeprazole (PRILOSEC) 40 MG capsule Take 30- 60 min before your first and last meals of the day  ? valACYclovir (VALTREX) 1000 MG tablet Take 1,000 mg by mouth 2 (two) times daily as needed.  ?     ?  ? ? ? ? ? ?  Objective:  ? ?Wts ? ?12/31/2021          229   ?02/11/2021        235   ?01/01/21 234 lb 9.6 oz (106.4 kg)  ?12/21/20 235 lb (106.6 kg)  ?  ?  ?Vital signs reviewed  12/31/2021  - Note at rest 02 sats  97% on RA  ? ?General appearance:    hoarse obese wf/ harsh upper airway cough   ?  ? HEENT : nl exam   ? ? ?NECK :  without JVD/Nodes/TM/ nl carotid upstrokes bilaterally ? ? ?LUNGS: no acc muscle use,  Nl contour chest which is clear to A and P bilaterally without cough on insp or exp maneuvers ? ? ?CV:  RRR  no s3  2-3/6 SEM  s increase in P2, and no edema  ? ?ABD:  quite obese but soft and nontender with nl inspiratory excursion in the supine position. No bruits or organomegaly appreciated, bowel sounds nl ? ?MS:  Nl gait/ ext warm without deformities, calf tenderness, cyanosis or clubbing ?No obvious joint restrictions  ? ?SKIN: warm and dry without lesions   ? ?NEURO:  alert, approp, nl sensorium with  no motor or cerebellar deficits apparent.  ? ?   ?  ?I personally reviewed images and agree with radiology impression as follows:  ? Chest   12/09/21 = [facility] ?No emphysema/ no nodules  ? ?  ? ? ?   ?Assessment  ? ?  ?   ?

## 2021-12-31 NOTE — Assessment & Plan Note (Addendum)
PFT's  02/20/21    FEV1 1.10 (48 % ) ratio 0.75  p 0 % improvement from saba p 0 prior to study with DLCO  12.07 (60%) corrects to 3.83 (92%)  for alv volume and FV curve mild concavity with ERV  28% at wt 210   ? ?She is a gold 0 copd (at risk but nothing on pfts or ct to suggest so main rx is stop smoking)  ? ?I had an extended discussion with the patient and reviewed all relevant studies so total time was > 30 minutes with moderate level of MDM. ? ?Each maintenance medication was reviewed in detail including most importantly the difference between maintenance and prns and under what circumstances the prns are to be triggered using an action plan format that is not reflected in the computer generated alphabetically organized AVS.   ? ? ?Please see AVS for specific instructions unique to this visit that I personally wrote and verbalized to the the pt in detail and then reviewed with pt  by my nurse highlighting any  changes in therapy recommended at today's visit to their plan of care.   ?

## 2021-12-31 NOTE — Assessment & Plan Note (Signed)
See echo 12/25/20 in care [facility]:  Moderate AS with elevated LA pressure  ?- referred to [facility] at her request 12/31/2021  ? ?There is no reason she could not have surgery if needed from my perspective ?

## 2021-12-31 NOTE — Addendum Note (Signed)
Addended by: Christinia Gully B on: 12/31/2021 05:00 PM ? ? Modules accepted: Orders ? ?

## 2021-12-31 NOTE — Assessment & Plan Note (Addendum)
4-5 min discussion re active cigarette smoking in addition to office E&M ? ?Ask about tobacco use:   ongoing ?Advise quitting   I took an extended  opportunity with this patient to outline the consequences of continued cigarette use  in airway disorders based on all the data we have from the multiple national lung health studies (perfomed over decades at millions of dollars in cost)  indicating that smoking cessation, not choice of inhalers or physicians, is the most important aspect of her care.   ?Assess willingness:  Not committed at this point ?Assist in quit attempt:  Per PCP when ready ?Arrange follow up:   Follow up per Primary Care planned ? ?Also rec continue screening CT for lung ca as planned ?  ?  ?  ?

## 2021-12-31 NOTE — Patient Instructions (Addendum)
The key is to stop smoking completely before smoking completely stops you! ? ?Continue omeprazole 40 mg Take 30-60 min before first meal of the day and pepcid 40 mg after supper  ? ?Need to get bed  blocks x 6-8 inches  ? ?I will refer you to [facility] in [place] for a second opinion about your aortic stenosis  ? ?Please see your ENT hoarseness and your throat breathing/ choking sensation  ? ?Stop proair respiclick for now - it may be affecting your voice  ? ?For cough > delsym 2 tsp every the counter every 12 hours as needed is the strongest otc you can get  ? ?Please call right away with discrepancies with your medication list  ? ? ?Please schedule a follow up visit in 3 months but call sooner if needed  ? ? ?   ?

## 2021-12-31 NOTE — Assessment & Plan Note (Signed)
Onset was 2016 while on ACEi ?- D/C ACEi  12/21/2020 > improved until pollen started > resolved as of 02/11/2021  ?- worse 12/31/2021  On Gerd rx and off acei > referred to ENT as has had prior surgery ? ?Concerned with smoking hx she needs to be screened again for laryngeal ca  ?

## 2022-01-03 ENCOUNTER — Encounter: Payer: Self-pay | Admitting: Internal Medicine

## 2022-01-06 NOTE — Addendum Note (Signed)
Addended by: Carleene Mains D on: 01/06/2022 01:57 PM ? ? Modules accepted: Orders ? ?

## 2022-02-06 ENCOUNTER — Ambulatory Visit: Payer: Medicare Other | Admitting: Cardiology

## 2022-02-13 ENCOUNTER — Encounter: Payer: Self-pay | Admitting: *Deleted

## 2022-02-14 ENCOUNTER — Ambulatory Visit (INDEPENDENT_AMBULATORY_CARE_PROVIDER_SITE_OTHER): Payer: Medicare Other | Admitting: Cardiology

## 2022-02-14 ENCOUNTER — Encounter: Payer: Self-pay | Admitting: Cardiology

## 2022-02-14 VITALS — BP 140/82 | HR 77 | Ht 65.0 in | Wt 224.0 lb

## 2022-02-14 DIAGNOSIS — I2510 Atherosclerotic heart disease of native coronary artery without angina pectoris: Secondary | ICD-10-CM

## 2022-02-14 DIAGNOSIS — R42 Dizziness and giddiness: Secondary | ICD-10-CM | POA: Diagnosis not present

## 2022-02-14 DIAGNOSIS — R0609 Other forms of dyspnea: Secondary | ICD-10-CM

## 2022-02-14 DIAGNOSIS — I350 Nonrheumatic aortic (valve) stenosis: Secondary | ICD-10-CM | POA: Diagnosis not present

## 2022-02-14 MED ORDER — IRBESARTAN 300 MG PO TABS: 300.0000 mg | ORAL_TABLET | Freq: Every day | ORAL | Status: DC

## 2022-02-14 MED ORDER — CARVEDILOL 3.125 MG PO TABS: 3.1250 mg | ORAL_TABLET | Freq: Two times a day (BID) | ORAL | Status: DC

## 2022-02-14 MED ORDER — ISOSORBIDE MONONITRATE ER 30 MG PO TB24: 30.0000 mg | ORAL_TABLET | Freq: Every day | ORAL | Status: DC

## 2022-02-14 NOTE — Progress Notes (Signed)
Clinical Summary Ms. Bunting is a 76 y.o.female seen today as a new consult, referred by Dr Sherene Sires for the following medical problems.   Previosuly followed by [facility] cardiology  1.CAD - limited info from [facility] notes - h/o MI in 1997, s/p PCI x3 stents in 1997, 2 stents in 2000. Notes don't indicate which vessels involved.  - from notes recent history of atypical chest pain, reproducible to palpation - 12/2020 nuclear stress [facility]: no ischemia - no recent chest pains - compliant with meds   2. HTN - she reports recent orthostatic dizziness -she stopped norvac, imdur, coreg, on her own. Ongoing symptoms.   - 140-150/8 3. Hyperlipidemia   4. Aortic stenosis  11/2020 echo mean grad 20, AVA 1.27 - Jan 2023 echo: LVEF 60%,  moderate AS mean grad 24, DI 0.4, AVA 1.25  5. Dizziness - x 3 weeks, only happens with standing.  - stopped taking bp meds over 1 week ago - dizziness with standing - decaf tea 1/2 gallon, defac sodas, no alcholohol. Rarely takes fluid pill - some vomiting at times.  - pcp note mentions 25 drop in bp when vitals checked by her sone who is an EMT  6. Carotid stenosis -  Past Medical History:  Diagnosis Date   Bilateral carotid bruits    Coronary artery disease with history of myocardial infarction without history of CABG    h/o MI in 1997, s/p PCI x3 stents in 1997, 2 stents in 2000   Essential hypertension    GAD (generalized anxiety disorder)    GERD (gastroesophageal reflux disease)    Mixed hyperlipidemia    Myocardial infarction (HCC) 1997   Nonrheumatic aortic (valve) stenosis    S/P coronary artery stent placement      Allergies  Allergen Reactions   Erythromycin Other (See Comments)    Jaundice   Levocetirizine Shortness Of Breath    "choking, can't breathe"   Rosuvastatin Nausea And Vomiting    Other reaction(s): Abdominal Pain, Bleeding, Constipation, Cough, Dizziness   Fosamax [Alendronate] Other (See Comments)    Muscle spasm       Current Outpatient Medications  Medication Sig Dispense Refill   aspirin EC 81 MG tablet Take 81 mg by mouth daily. Swallow whole.     atorvastatin (LIPITOR) 80 MG tablet Take 80 mg by mouth daily.     carvedilol (COREG) 3.125 MG tablet Take 3.125 mg by mouth 2 (two) times daily.     clindamycin (CLINDAGEL) 1 % gel Apply topically 2 (two) times daily as needed.     ergocalciferol (VITAMIN D2) 1.25 MG (50000 UT) capsule Take 50,000 Units by mouth once a week.     escitalopram (LEXAPRO) 20 MG tablet Take 20 mg by mouth daily.     famotidine (PEPCID) 40 MG tablet Take 1 tablet (40 mg total) by mouth at bedtime. 30 tablet 0   furosemide (LASIX) 20 MG tablet Take 20 mg by mouth daily as needed.     ibuprofen (ADVIL) 200 MG tablet Take 200 mg by mouth every 6 (six) hours as needed.     irbesartan (AVAPRO) 300 MG tablet Take 1 tablet (300 mg total) by mouth daily. 30 tablet 11   isosorbide mononitrate (IMDUR) 30 MG 24 hr tablet Take 30 mg by mouth daily.     Loratadine 10 MG CAPS Take by mouth daily as needed. Once daily as needed     LORazepam (ATIVAN) 0.5 MG tablet Take 0.5 mg by  mouth at bedtime.     nitroGLYCERIN (NITROSTAT) 0.4 MG SL tablet Place 0.4 mg under the tongue as needed for chest pain.     omeprazole (PRILOSEC) 40 MG capsule Take 30- 60 min before your first and last meals of the day     valACYclovir (VALTREX) 1000 MG tablet Take 1,000 mg by mouth 2 (two) times daily as needed.     No current facility-administered medications for this visit.     Past Surgical History:  Procedure Laterality Date   ABDOMINAL HYSTERECTOMY  1988   LAPAROSCOPIC GASTROTOMY W/ REPAIR OF ULCER  1992   LAPAROSCOPIC OOPHERECTOMY     PERCUTANEOUS CORONARY STENT INTERVENTION (PCI-S)     h/o MI in 1997, s/p PCI x3 stents in 1997, 2 stents in 2000     Allergies  Allergen Reactions   Erythromycin Other (See Comments)    Jaundice   Levocetirizine Shortness Of Breath    "choking, can't breathe"    Rosuvastatin Nausea And Vomiting    Other reaction(s): Abdominal Pain, Bleeding, Constipation, Cough, Dizziness   Fosamax [Alendronate] Other (See Comments)    Muscle spasm       Family History  Problem Relation Age of Onset   Depression Mother    Diabetes Mother    Heart failure Mother    Heart attack Mother 9   Breast cancer Mother    Heart attack Father 56   Heart attack Sister    Diabetes Brother    Depression Brother    Heart attack Brother 32   Heart attack Daughter      Social History Ms. Nussbaumer reports that she has been smoking cigarettes. She started smoking about 58 years ago. She has a 55.00 pack-year smoking history. She has never used smokeless tobacco. Ms. Paola has no history on file for alcohol use.   Review of Systems CONSTITUTIONAL: No weight loss, fever, chills, weakness or fatigue.  HEENT: Eyes: No visual loss, blurred vision, double vision or yellow sclerae.No hearing loss, sneezing, congestion, runny nose or sore throat.  SKIN: No rash or itching.  CARDIOVASCULAR: per hpi RESPIRATORY: No shortness of breath, cough or sputum.  GASTROINTESTINAL: No anorexia, nausea, vomiting or diarrhea. No abdominal pain or blood.  GENITOURINARY: No burning on urination, no polyuria NEUROLOGICAL: No headache, dizziness, syncope, paralysis, ataxia, numbness or tingling in the extremities. No change in bowel or bladder control.  MUSCULOSKELETAL: No muscle, back pain, joint pain or stiffness.  LYMPHATICS: No enlarged nodes. No history of splenectomy.  PSYCHIATRIC: No history of depression or anxiety.  ENDOCRINOLOGIC: No reports of sweating, cold or heat intolerance. No polyuria or polydipsia.  Marland Kitchen   Physical Examination Today's Vitals   02/14/22 1325  BP: 140/82  Pulse: 77  SpO2: 95%  Weight: 224 lb (101.6 kg)  Height: 5\' 5"  (1.651 m)   Body mass index is 37.28 kg/m.  Gen: resting comfortably, no acute distress HEENT: no scleral icterus, pupils equal  round and reactive, no palptable cervical adenopathy,  CV: RRR, 3/6 systolic murmur rusb, bilateral carotid bruits Resp: Clear to auscultation bilaterally GI: abdomen is soft, non-tender, non-distended, normal bowel sounds, no hepatosplenomegaly MSK: extremities are warm, no edema.  Skin: warm, no rash Neuro:  no focal deficits Psych: appropriate affect   Diagnostic Studies  [facility] data Cardiac echo from March, 2022 revealed  normal LV systolic function with EF 60 to 65%,  normal LV wall motion,  moderate LV diastolic dysfunction,  aortic valve gradients are moderately elevated, with  peak and mean gradients of 37.000 and 20.000 mmHg. Peak velocity 3 m/s. Dimensionless index 0.40.  Calculated aortic valve area 1.27 cm.  Moderate AS. Trace AI. RVSP is mildly elevated - about 38 mmHg.  Nuclear stress test from April, 2022 revealed  no evidence of ischemia.  Probable small anterior scar, but breast attenuation was noted at rest and after Lexiscan infusion and could mildly decrease specificity of this study.  Pt has h/o MI in 1997. Normal left ventricular systolic function and normal wall motion.  It was low risk stress test.  Stress echo from March 2019-no evidence of ischemia. Functional capacity 5 METS. Electrically negative. No chest pain.  Patient achieved 82% of maximal predicted heart rate. Normal LV systolic function with EF 55 to 60%. Mild aortic stenosis.  Nuclear stress test from April 2019 Equivocal cardiac perfusion exam for minimal apical ischemia.  Prognostically this is a low risk scan as per report. Normal LVEF 69%. Normal LV wall motion.  Chest CTA from November 15, 2020 1. No acute pulmonary embolus. 2. Lungs are clear. 3. 3.4 cm right adrenal nodule could be a lipid poor adenoma but is indeterminate. 4. Coronary artery calcifications.  CT chest in November 22, 2020- Patent central airways. No pleural effusion or pneumothorax. 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 Severe coronary calcifications. A 3.3 x 2.5 cm right adrenal nodule.  Carotid doppler from June, 2022 was negative.   Jan 2023 echo Mitral Valve: There is moderate posterior annular calcification.    Mitral Valve: Mitral valve structure is normal. The leaflets are mildly  thickened and exhibit normal excursion.    Aortic Valve: There is moderate stenosis, with peak and mean gradients  of 43.000 and 24.000 mmHg.  Peak velocity 3.3 m/s.  Dimensionless index  0.4.  Calculated aortic valve area 1.25 cm.    Aortic Valve: Mild aortic valve regurgitation.    Aortic Valve: The aortic valve is tricuspid. The leaflets are  moderately thickened and exhibit moderately reduced excursion. The  leaflets are moderately calcified.    Left Ventricle: Doppler parameters are indeterminate for diastolic  function.    Left Ventricle: There is mild concentric hypertrophy.    Left Ventricle: Systolic function is normal. EF: 57-60%. Quantitative  analysis of left ventricular Global Longitudinal Strain (GLS) imaging is  -14.600%.    Left Ventricle: Wall motion is normal.    Tricuspid Valve: The right ventricular systolic pressure is normal (<36  mmHg).  Assessment and Plan   CAD -no recent symptoms, [facility] without [facility] - continue current meds  2. Aortic stenosis - moderate AS by recent echo, repeat in 1 year. No indication for intervention at this time  3. Dizziness - orthostatic dizziness. Reports son who is EMT checked orthostatics with 25 point SBP drop. Orthostatics negaitve here, were also negative at recent pcp visit - drinks mainly tea, sodas though both decaf. Encouraged more water and electrolytes, monitor symptoms. For now hold coreg, imdur, norvasc, irbesartan which she has held on her own the last week  4. HTN For now hold coreg, imdur, norvasc, irbesartan which she has held  on her own the last week given orthostatic dizziness.       Antoine PocheJonathan F. Salaam Battershell, M.D.

## 2022-02-14 NOTE — Patient Instructions (Addendum)
Medication Instructions:  Please hold the following medications: Norvasc (Amlodipine) Coreg (Carvedilol) Imdur (Isosorbide) Irbesartan (Avapro) Continue all other medications.     Labwork: none  Testing/Procedures: none  Follow-Up: 4 months   Any Other Special Instructions Will Be Listed Below (If Applicable). Please call the office or send mychart message on Monday to update [place] on your dizziness.   If you need a refill on your cardiac medications before your next appointment, please call your pharmacy.

## 2022-02-21 ENCOUNTER — Telehealth: Payer: Self-pay | Admitting: Cardiology

## 2022-02-21 NOTE — Telephone Encounter (Signed)
Spoke to pt who stated that she is feeling dizzy/lightheased w/ increased nausea/vomiting. Pt stated that she is still not taking Imdur, Norvasc, Coreg, Irbesartan. Pt stated that her bp this morning was 138/72 and hr was 72. Pt also stated that she woke up with a cough this morning and expelled mucus. Please advise.

## 2022-02-21 NOTE — Telephone Encounter (Signed)
STAT if patient feels like he/she is going to faint   Are you dizzy now? Yes   Do you feel faint or have you passed out?   Do you have any other symptoms? Everything starts spinnning; patient gets nauseated  Have you checked your HR and BP (record if available)? 138/78; 72

## 2022-02-25 NOTE — Telephone Encounter (Signed)
Patient notified and verbalized understanding. Pt had no other questions/concerns at this time. Pt verbalized agreement to f/u w/ PCP for nausea/vomiting.

## 2022-02-25 NOTE — Telephone Encounter (Signed)
BP's are fine off meds, would stay off for now. With nausea and vomiting needs to f/u with pcp, her symptoms are not cardiac  Dominga Ferry MD

## 2022-03-06 ENCOUNTER — Telehealth: Payer: Self-pay | Admitting: *Deleted

## 2022-03-06 NOTE — Telephone Encounter (Signed)
   Pre-operative Risk Assessment    Patient Name: Jenna Allen  DOB: 1945/12/06 MRN: [id number]     Request for Surgical Clearance{ 1. What type of surgery is being performed? Enter name of procedure below and number of teeth if dental extraction.  :1   Procedure:  Dental Extraction - Amount of Teeth to be Pulled:  25  Date of Surgery:  Clearance TBD                                 Surgeon:  Dr. Gery Pray Surgeon's Group or Practice Name:  [facility] Phone number:  [phone] Fax number:  [phone]   Type of Clearance Requested:   - Medical    Type of Anesthesia:   IV Sedation versed & narcotics in office   Additional requests/questions:  Does this patient need antibiotics? If so, type of antibiotic allowed/recommended? Type of pain medication allowed/recommended  Signed, Thalia Bloodgood   03/06/2022, 4:28 PM

## 2022-03-06 NOTE — Telephone Encounter (Deleted)
   Pre-operative Risk Assessment    Patient Name: Jenna Allen  DOB: 1946-05-23 MRN: [id number] HEARTCARE STAFF-IMPORTANT INSTRUCTIONS 1 Red and Blue Text will auto delete once note is signed or closed. 2 Press F2 to navigate through template.   3 On drop down lists, L click to select >> R click to activate next field 4 Reason for Visit format is IMPORTANT!!  See Directions on No. 2 below. 5 Please review chart to determine if there is already a clearance note open for this procedure!!  DO NOT duplicate if a note already exists!!    :1}      Request for Surgical Clearance{ 1. What type of surgery is being performed? Enter name of procedure below and number of teeth if dental extraction.  :1}    Procedure:  Dental Extraction - Amount of Teeth to be Pulled:  *** { 2. When is this surgery scheduled? Press F2 to enter date below and place date in Reason for Visit (see directions below). :1} Date of Surgery:  Clearance {Select MM/DD/YY or TBD      :[id number]                             { For convenience, highlight and copy (CTL+C) the Clearance MM/DD/YY phrase above. Click here to go to Reason for Visit.  Paste (CTL+V) the date.  Merchandiser, retail.  Then click button underneath called Add Clearance MM/DD/YY as free text.     :1}  { 3. What is the name of the Surgeon, the Surgeon's Group or Practice, phone and fax number?  Press F2 and list below :1}  Surgeon:  *** Surgeon's Group or Practice Name:  *** Phone number:  *** Fax number:  *** { 4. What type of clearance is requested?  Medical or Cardiac Clearance only?  Pharmacy Clearance Only (Request is to hold medication only)?  Or Both?  Press F2 and select the clearance requested.  If both are needed, select both from the drop down list.     :1}  Type of Clearance Requested:   {Select Medical, Pharmacy or Both Medical AND Pharmacy:[id number]} { 5. What type of anesthesia will be used?  Press F2 and select the anesthesia to be used for the  procedure.  :1}  Type of Anesthesia:  {Select Anesthesia to be RS:[id number] { 6. Are there any other requests or questions from the surgeon?    :1}  Additional requests/questions:  {Select additional requests/questions or use asterisks to free text (Optional):[id number]}  Signed, Marlou Sa   03/06/2022, 4:27 PM

## 2022-03-10 NOTE — Telephone Encounter (Signed)
I s/w the DDS office and confirmed that the pt is going to be having all remaining teeth extracted (23-25 teeth)

## 2022-03-10 NOTE — Telephone Encounter (Signed)
   Primary Cardiologist: Carlyle Dolly, MD   Chart reviewed as part of pre-operative protocol coverage. Given past medical history and time since last visit, based on ACC/AHA guidelines, Jenna Allen would be at acceptable risk for the planned procedure without further cardiovascular testing. I reached out to the patient to check on her hypotension. Her main concern at this time is nausea, vomiting, and dizziness. She has no cardiac concerns.   Patient was advised that if she develops new symptoms prior to surgery to contact our office to arrange a follow-up appointment.  He verbalized understanding.  I will route this recommendation to the requesting party via Epic fax function and remove from pre-op pool.  Please call with questions.  Emmaline Life, NP-C    03/10/2022, 1:48 PM [place] [id number], [address] Office [phone] Fax [phone]

## 2022-03-10 NOTE — Telephone Encounter (Signed)
Jessica from [facility] calling to get update on this clearance. Req call back.

## 2022-03-10 NOTE — Telephone Encounter (Signed)
Please call to clarify, is this tooth #25 or 25 teeth?

## 2022-04-02 ENCOUNTER — Ambulatory Visit (INDEPENDENT_AMBULATORY_CARE_PROVIDER_SITE_OTHER): Payer: Medicare Other | Admitting: Internal Medicine

## 2022-04-02 ENCOUNTER — Encounter: Payer: Self-pay | Admitting: Internal Medicine

## 2022-04-02 DIAGNOSIS — F17210 Nicotine dependence, cigarettes, uncomplicated: Secondary | ICD-10-CM

## 2022-04-02 DIAGNOSIS — R0609 Other forms of dyspnea: Secondary | ICD-10-CM

## 2022-04-02 DIAGNOSIS — I350 Nonrheumatic aortic (valve) stenosis: Secondary | ICD-10-CM | POA: Diagnosis not present

## 2022-04-02 DIAGNOSIS — R058 Other specified cough: Secondary | ICD-10-CM | POA: Diagnosis not present

## 2022-04-02 NOTE — Progress Notes (Unsigned)
Jenna Allen, female    DOB: 02-Feb-1946    MRN: [id number]   Brief patient profile:  60  yowf active smoker with hbp on ACEi since 1990s with onset around 2016 rhinitis/ cough worse with strong smells rx ent with polyp surgery in [place] ? 08/2019 but "no better throat spasms"  Jan 2022 Covid pna   referred to pulmonary clinic in [place]  12/21/2020 by Dr Lauretta Grill NP for sob   History of Present Illness  12/21/2020  Pulmonary/ 1st office eval/ Jenna Allen / [place] [facility]  Chief Complaint  Patient presents with   Consult    Productive cough with clear phlegm, shortness of breath "when I get upset or walk long distance"  Dyspnea:  Maybe 3 aisles  Cough: severe when exposed to groceries or strong smells  Sleep: bed is flat one pillow does  SABA use:  None  rec Change prilosec to Take 30- 60 min before your first and last meals of the day  Stop lisinopril  Start avapro (ibesartan) 300 mg each and break in half if too strong  Please schedule a follow up office visit in 2 weeks, sooner if needed  with all medications   01/01/2021  f/u ov/[name] office/Jenna Allen re: UACS ? ACEi related  Chief Complaint  Patient presents with   Follow-up    Cough had basically resolved until about 4-5 days ago- relates to pollen. She is coughing up minimal clear sputum. Feels fatigued.   Dyspnea:  Better until 5 day prior to OV   Cough: as usual, flared with pollen  Sleeping: flat bed /one pilow SABA use: none 02: none  Covid status: vax x 2 and infected Lung cancer screening: [facility] 11/20/20  3 mm only rec For cough > mucinex dm 1200 mg every 12 hours as needed  Prednisone 10 mg take  4 each am x 2 days,   2 each am x 2 days,  1 each am x 2 days and stop  prilosec 40 mg Take 30- 60 min before your first and last meals of the day and pepcid 20 mg x 2 an hour before bedtime  GERD  Diet  The key is to stop smoking completely before smoking completely stops you! Please schedule a follow up  office visit in 6 weeks,  PFTs on return    02/11/2021  f/u ov/[place] office/Jenna Allen re:  UACS / active smoker Chief Complaint  Patient presents with   Follow-up    Cough and SOB have improved some since last visit.   Dyspnea:   Much better now, walks whole grocery store/ no  HC parking  Cough:  Daytime, ? p coreg  Overall much better clear mucus  Sleeping: bed blocks / one pillow does fine  SABA use: none  02: none  Covid status: vax x 2 Lung cancer screening   Rec Discuss low dose CT chest for lung cancer screening with your Primary care provider  Your coreg (corevidol) may be prone to causing a cough or wheeze so would discuss substituting bisoprolol with him   The key is to stop smoking completely before smoking completely stops you!    12/31/2021  f/u ov/[place] office/Jenna Allen re: doe maint on no resp meds/ still smoking   Chief Complaint  Patient presents with   Follow-up    Patient states she needs a new valve for her heart and cardiology said due to her breathing she wouldn't make it through surgery. Patient feels like breathing is the same  since last visit.   Dyspnea:  [employer] gets tired in legs / uses h[employer] parking but really not limited much by doe  Doesn't go to mb anymore due to legs  Can still walk 300 ft slower than others can't do steps  MMRC3 = can't walk 100 yards even at a slow pace at a flat grade s stopping due to sob   Cough: more than usual x 3 weeks > uc in [place] neg cov rx pred / amox  Sleeping: no bed blocks / one pillow  SABA use: has proair respiclick not helping  02: none  Covid status: vax x 2  Rec The key is to stop smoking completely before smoking completely stops you! Continue omeprazole 40 mg Take 30-60 min before first meal of the day and pepcid 40 mg after supper  Need to get bed  blocks x 6-8 inches  I will refer you to [facility] in [place] for a second opinion about your aortic stenosis  Please see your ENT hoarseness and your  throat breathing/ choking sensation  Stop proair respiclick for now - it may be affecting your voice  For cough > delsym 2 tsp every the counter every 12 hours as needed is the strongest otc you can get   Please call right away with discrepancies with your medication list       04/02/2022  f/u ov/ office/Jenna Allen re: UACS/ AS ([place])  maint on gerd rx/ still smoking  Chief Complaint  Patient presents with   Follow-up    Cough has not improved. Has seen cardiology and ENT .    Dyspnea:  limited by knees not sob  Cough: hoarse / smoker's rattle not worse in am  Sleeping: flat in bed one pillow  SABA use: not sure helps  02: none  Nausea/ vomiting x weeks  Vertigo lying down better,   episodic usually one  one per day then better p lie down and get back up    Lung cancer screening: 12/09/21 no significant nodules f/u yearly    No obvious day to day or daytime variability or assoc excess/ purulent sputum or mucus plugs or hemoptysis or cp or chest tightness, subjective wheeze or overt sinus or hb symptoms.   Sleeping  without nocturnal  or early am exacerbation  of respiratory  c/o's or need for noct saba. Also denies any obvious fluctuation of symptoms with weather or environmental changes or other aggravating or alleviating factors except as outlined above   No unusual exposure hx or h/o childhood pna/ asthma or knowledge of premature birth.  Current Allergies, Complete Past Medical History, Past Surgical History, Family History, and Social History were reviewed in [employer] record.  ROS  The following are not active complaints unless bolded Hoarseness, sore throat, dysphagia, dental problems, itching, sneezing,  nasal congestion or discharge of excess mucus or purulent secretions, ear ache,   fever, chills, sweats, unintended wt loss or wt gain, classically pleuritic or exertional cp,  orthopnea pnd or arm/hand swelling  or leg swelling, presyncope,  palpitations, abdominal pain, anorexia, nausea, vomiting, diarrhea  or change in bowel habits or change in bladder habits, change in stools or change in urine, dysuria, hematuria,  rash, arthralgias, visual complaints, headache, numbness, weakness or ataxia or problems with walking or coordination,  change in mood or  memory. Vertigo aleady seeing ent/ f/u neuro planned        Current Meds  Medication Sig   aspirin  EC 81 MG tablet Take 81 mg by mouth daily. Swallow whole.   atorvastatin (LIPITOR) 80 MG tablet Take 80 mg by mouth daily.   benzonatate (TESSALON) 100 MG capsule Take 100 mg by mouth 3 (three) times daily as needed.   clindamycin (CLINDAGEL) 1 % gel Apply topically 2 (two) times daily as needed.   ergocalciferol (VITAMIN D2) 1.25 MG (50000 UT) capsule Take 50,000 Units by mouth once a week.   famotidine (PEPCID) 40 MG tablet Take 1 tablet (40 mg total) by mouth at bedtime.   furosemide (LASIX) 20 MG tablet Take 20 mg by mouth daily as needed.   gabapentin (NEURONTIN) 300 MG capsule Take 300 mg by mouth 3 (three) times daily.   ibuprofen (ADVIL) 200 MG tablet Take 200 mg by mouth every 6 (six) hours as needed.   Loratadine 10 MG CAPS Take by mouth daily as needed. Once daily as needed   LORazepam (ATIVAN) 0.5 MG tablet Take 0.5 mg by mouth at bedtime.   nitroGLYCERIN (NITROSTAT) 0.4 MG SL tablet Place 0.4 mg under the tongue as needed for chest pain.   omeprazole (PRILOSEC) 40 MG capsule Take 30- 60 min before your first and last meals of the day   valACYclovir (VALTREX) 1000 MG tablet Take 1,000 mg by mouth 2 (two) times daily as needed.                       Objective:   Wts  04/02/2022          231  12/31/2021          229   02/11/2021        235   01/01/21 234 lb 9.6 oz (106.4 kg)  12/21/20 235 lb (106.6 kg)      Vital signs reviewed  04/02/2022  - Note at rest 02 sats  97% on RA   General appearance:    amb slt hoarse wf nad         HEENT : Oropharynx  clear       Nasal turbinates nl    NECK :  without  apparent JVD/ palpable Nodes/TM    LUNGS: no acc muscle use,  Nl contour chest which is clear to A and P bilaterally without cough on insp or exp maneuvers   CV:  RRR  no s3  2-3/6 SEM or increase in P2, and no edema   ABD:  soft and nontender with nl inspiratory excursion in the supine position. No bruits or organomegaly appreciated   MS:   ext warm without deformities Or obvious joint restrictions  calf tenderness, cyanosis or clubbing    SKIN: warm and dry without lesions    NEURO:  alert, approp, nl sensorium with  no motor or cerebellar deficits apparent.              Assessment

## 2022-04-02 NOTE — Patient Instructions (Signed)
Pulmoary follow up is as needed  - keep up your appoints with ent/ neurology and cardiology

## 2022-04-03 ENCOUNTER — Encounter: Payer: Self-pay | Admitting: Internal Medicine

## 2022-04-03 NOTE — Assessment & Plan Note (Signed)
Onset was 2016 while on ACEi - D/C ACEi  12/21/2020 > improved until pollen started > resolved as of 02/11/2021  - worse 12/31/2021  On Gerd rx and off acei > referred to ENT as has had prior surgery  Being followed by ENT now but no notes in Epic  >>>  Continue on gerd rx and off ACEi

## 2022-04-03 NOTE — Assessment & Plan Note (Addendum)
Active smoker PFT's  02/20/21    FEV1 1.10 (48 % ) ratio 0.75  p 0 % improvement from saba p 0 prior to study with DLCO  12.07 (60%) corrects to 3.83 (92%)  for alv volume and FV curve mild concavity with ERV  28% at wt 210    No evidence of a pulmonary limitation clinically / f/u in this clinic can be prn          Each maintenance medication was reviewed in detail including emphasizing most importantly the difference between maintenance and prns and under what circumstances the prns are to be triggered using an action plan format where appropriate.  Total time for H and P, chart review, counseling,   and generating customized AVS unique to this office visit / same day charting = 25 min

## 2022-04-03 NOTE — Assessment & Plan Note (Addendum)
Counseled re importance of smoking cessation but did not meet time criteria for separate billing   °

## 2022-04-03 NOTE — Assessment & Plan Note (Signed)
See echo 12/25/20 in care [facility]:  Moderate AS with elevated LA pressure  - referred to [facility] at her request 12/31/2021 > f/u Dr Wyline Mood

## 2022-06-13 ENCOUNTER — Encounter: Payer: Self-pay | Admitting: Cardiology

## 2022-06-13 ENCOUNTER — Ambulatory Visit: Payer: Medicare Other | Attending: Cardiology | Admitting: Cardiology

## 2022-06-13 ENCOUNTER — Encounter: Payer: Self-pay | Admitting: *Deleted

## 2022-06-13 VITALS — BP 134/80 | HR 83 | Ht 65.0 in | Wt 228.2 lb

## 2022-06-13 DIAGNOSIS — R42 Dizziness and giddiness: Secondary | ICD-10-CM | POA: Diagnosis not present

## 2022-06-13 DIAGNOSIS — I2510 Atherosclerotic heart disease of native coronary artery without angina pectoris: Secondary | ICD-10-CM

## 2022-06-13 DIAGNOSIS — I350 Nonrheumatic aortic (valve) stenosis: Secondary | ICD-10-CM | POA: Diagnosis not present

## 2022-06-13 NOTE — Progress Notes (Signed)
Clinical Summary Ms. Butrick is a 76 y.o.female   seen today as a new consult, referred by Dr Sherene Sires for the following medical problems.    Previosuly followed by [facility] cardiology   1.CAD - limited info from [facility] notes - h/o MI in 1997, s/p PCI x3 stents in 1997, 2 stents in 2000. Notes don't indicate which vessels involved.   - 12/2020 nuclear stress [facility]: no ischemia - no chest pains, no SOB/DOE -     2. HTN -she stopped norvac, imdur, coreg, irbesartan, previously due to orthostatic dizziness - off meds bp's have actually been fine, has not had to restart    3. Hyperlipidemia - 10/2021 TC 127 TG 74 HDL 47 LDL 65 - she is on atorvastin -      4. Aortic stenosis  11/2020 echo mean grad 20, AVA 1.27 - Jan 2023 echo: LVEF 60%,  moderate AS mean grad 24, DI 0.4, AVA 1.25 - no symptoms.    5. Dizziness - x 3 weeks, only happens with standing.  - stopped taking bp meds over 1 week ago - dizziness with standing - decaf tea 1/2 gallon, defac sodas, no alcholohol. Rarely takes fluid pill - some vomiting at times.  - pcp note mentions 25 drop in bp when vitals checked by her sone who is an EMT  - isolated episode of dizziness with standing since last visit. Overall much improved off bp meds and with working toward increased hydration   6. Carotid bruits - 02/2021 [place] [facility] no stenosis Past Medical History:  Diagnosis Date   Bilateral carotid bruits    Coronary artery disease with history of myocardial infarction without history of CABG    h/o MI in 1997, s/p PCI x3 stents in 1997, 2 stents in 2000   Essential hypertension    GAD (generalized anxiety disorder)    GERD (gastroesophageal reflux disease)    Mixed hyperlipidemia    Myocardial infarction (HCC) 1997   Nonrheumatic aortic (valve) stenosis    S/P coronary artery stent placement      Allergies  Allergen Reactions   Erythromycin Other (See Comments)    Jaundice   Levocetirizine Shortness Of Breath     "choking, can't breathe"   Rosuvastatin Nausea And Vomiting    Other reaction(s): Abdominal Pain, Bleeding, Constipation, Cough, Dizziness   Fosamax [Alendronate] Other (See Comments)    Muscle spasm      Current Outpatient Medications  Medication Sig Dispense Refill   aspirin EC 81 MG tablet Take 81 mg by mouth daily. Swallow whole.     atorvastatin (LIPITOR) 80 MG tablet Take 80 mg by mouth daily.     benzonatate (TESSALON) 100 MG capsule Take 100 mg by mouth 3 (three) times daily as needed.     clindamycin (CLINDAGEL) 1 % gel Apply topically 2 (two) times daily as needed.     ergocalciferol (VITAMIN D2) 1.25 MG (50000 UT) capsule Take 50,000 Units by mouth once a week.     famotidine (PEPCID) 40 MG tablet Take 1 tablet (40 mg total) by mouth at bedtime. 30 tablet 0   furosemide (LASIX) 20 MG tablet Take 20 mg by mouth daily as needed.     gabapentin (NEURONTIN) 300 MG capsule Take 300 mg by mouth 3 (three) times daily.     ibuprofen (ADVIL) 200 MG tablet Take 200 mg by mouth every 6 (six) hours as needed.     Loratadine 10 MG CAPS Take by  mouth daily as needed. Once daily as needed     LORazepam (ATIVAN) 0.5 MG tablet Take 0.5 mg by mouth at bedtime.     nitroGLYCERIN (NITROSTAT) 0.4 MG SL tablet Place 0.4 mg under the tongue as needed for chest pain.     omeprazole (PRILOSEC) 40 MG capsule Take 30- 60 min before your first and last meals of the day     valACYclovir (VALTREX) 1000 MG tablet Take 1,000 mg by mouth 2 (two) times daily as needed.     No current facility-administered medications for this visit.     Past Surgical History:  Procedure Laterality Date   ABDOMINAL HYSTERECTOMY  1988   LAPAROSCOPIC GASTROTOMY W/ REPAIR OF ULCER  1992   LAPAROSCOPIC OOPHERECTOMY     PERCUTANEOUS CORONARY STENT INTERVENTION (PCI-S)     h/o MI in 1997, s/p PCI x3 stents in 1997, 2 stents in 2000     Allergies  Allergen Reactions   Erythromycin Other (See Comments)    Jaundice    Levocetirizine Shortness Of Breath    "choking, can't breathe"   Rosuvastatin Nausea And Vomiting    Other reaction(s): Abdominal Pain, Bleeding, Constipation, Cough, Dizziness   Fosamax [Alendronate] Other (See Comments)    Muscle spasm       Family History  Problem Relation Age of Onset   Depression Mother    Diabetes Mother    Heart failure Mother    Heart attack Mother 36   Breast cancer Mother    Heart attack Father 32   Heart attack Sister    Diabetes Brother    Depression Brother    Heart attack Brother 77   Heart attack Daughter      Social History Ms. Finkbiner reports that she has been smoking cigarettes. She started smoking about 59 years ago. She has a 55.00 pack-year smoking history. She has never used smokeless tobacco. Ms. Mattos has no history on file for alcohol use.   Review of Systems CONSTITUTIONAL: No weight loss, fever, chills, weakness or fatigue.  HEENT: Eyes: No visual loss, blurred vision, double vision or yellow sclerae.No hearing loss, sneezing, congestion, runny nose or sore throat.  SKIN: No rash or itching.  CARDIOVASCULAR: per hpi RESPIRATORY: No shortness of breath, cough or sputum.  GASTROINTESTINAL: No anorexia, nausea, vomiting or diarrhea. No abdominal pain or blood.  GENITOURINARY: No burning on urination, no polyuria NEUROLOGICAL: No headache, dizziness, syncope, paralysis, ataxia, numbness or tingling in the extremities. No change in bowel or bladder control.  MUSCULOSKELETAL: No muscle, back pain, joint pain or stiffness.  LYMPHATICS: No enlarged nodes. No history of splenectomy.  PSYCHIATRIC: No history of depression or anxiety.  ENDOCRINOLOGIC: No reports of sweating, cold or heat intolerance. No polyuria or polydipsia.  Marland Kitchen   Physical Examination Today's Vitals   06/13/22 1542  BP: 134/80  Pulse: 83  SpO2: 100%  Weight: 228 lb 3.2 oz (103.5 kg)  Height: 5\' 5"  (1.651 m)   Body mass index is 37.97 kg/m.  Gen: resting  comfortably, no acute distress HEENT: no scleral icterus, pupils equal round and reactive, no palptable cervical adenopathy,  CV: RRR, 3/6 systolic murmur rusb, no jvd/ +bilateral carotid bruits Resp: Clear to auscultation bilaterally GI: abdomen is soft, non-tender, non-distended, normal bowel sounds, no hepatosplenomegaly MSK: extremities are warm, no edema.  Skin: warm, no rash Neuro:  no focal deficits Psych: appropriate affect   Diagnostic Studies  [facility] data Cardiac echo from March, 2022 revealed  normal LV  systolic function with EF 60 to 65%,  normal LV wall motion,  moderate LV diastolic dysfunction,  aortic valve gradients are moderately elevated, with peak and mean gradients of 37.000 and 20.000 mmHg. Peak velocity 3 m/s. Dimensionless index 0.40.  Calculated aortic valve area 1.27 cm.  Moderate AS. Trace AI. RVSP is mildly elevated - about 38 mmHg.  Nuclear stress test from April, 2022 revealed  no evidence of ischemia.  Probable small anterior scar, but breast attenuation was noted at rest and after Lexiscan infusion and could mildly decrease specificity of this study.  Pt has h/o MI in 1997. Normal left ventricular systolic function and normal wall motion.  It was low risk stress test.  Stress echo from March 2019-no evidence of ischemia. Functional capacity 5 METS. Electrically negative. No chest pain.  Patient achieved 82% of maximal predicted heart rate. Normal LV systolic function with EF 55 to 60%. Mild aortic stenosis.  Nuclear stress test from April 2019 Equivocal cardiac perfusion exam for minimal apical ischemia.  Prognostically this is a low risk scan as per report. Normal LVEF 69%. Normal LV wall motion.  Chest CTA from November 15, 2020 1. No acute pulmonary embolus. 2. Lungs are clear. 3. 3.4 cm right adrenal nodule could be a lipid poor adenoma but is indeterminate. 4. Coronary artery calcifications.  CT chest in November 22, 2020- Patent  central airways. No pleural effusion or pneumothorax. 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 Severe coronary calcifications. A 3.3 x 2.5 cm right adrenal nodule.  Carotid doppler from June, 2022 was negative.    Jan 2023 echo Mitral Valve: There is moderate posterior annular calcification.    Mitral Valve: Mitral valve structure is normal. The leaflets are mildly  thickened and exhibit normal excursion.    Aortic Valve: There is moderate stenosis, with peak and mean gradients  of 43.000 and 24.000 mmHg.  Peak velocity 3.3 m/s.  Dimensionless index  0.4.  Calculated aortic valve area 1.25 cm.    Aortic Valve: Mild aortic valve regurgitation.    Aortic Valve: The aortic valve is tricuspid. The leaflets are  moderately thickened and exhibit moderately reduced excursion. The  leaflets are moderately calcified.    Left Ventricle: Doppler parameters are indeterminate for diastolic  function.    Left Ventricle: There is mild concentric hypertrophy.    Left Ventricle: Systolic function is normal. EF: 57-60%. Quantitative  analysis of left ventricular Global Longitudinal Strain (GLS) imaging is  -14.600%.    Left Ventricle: Wall motion is normal.    Tricuspid Valve: The right ventricular systolic pressure is normal (<36  mmHg).   Assessment and Plan    CAD -no symptoms, contniue curren tmeds   2. Aortic stenosis - moderate AS by recent echo -repeat echo after next visit   3. Dizziness - orthostatic dizziness. Reports son who is EMT checked orthostatics with 25 point SBP drop. -symptoms much improved with increased hydration and off bp meds, continue to monitor    4. HTN Bp's look fine today, off all prior bp meds due to orthostatic dizzines and has not had to go back on any due to bp's being at goal.       Antoine Poche, M.D.

## 2022-06-13 NOTE — Patient Instructions (Signed)
Medication Instructions:  Continue all current medications.   Labwork: none  Testing/Procedures: none  Follow-Up: 6 months   Any Other Special Instructions Will Be Listed Below (If Applicable).   If you need a refill on your cardiac medications before your next appointment, please call your pharmacy.  

## 2022-09-24 ENCOUNTER — Telehealth (HOSPITAL_BASED_OUTPATIENT_CLINIC_OR_DEPARTMENT_OTHER): Payer: Self-pay | Admitting: Family

## 2022-09-24 NOTE — Telephone Encounter (Signed)
Spoke with patient regarding the Tuesday 10/07/22 11:20 am appointment with Gillian Shields, NP---rescheduled to Friday 10/03/22 at 2:45 pm.  Information is in My Chart and patient voiced her understanding.

## 2022-10-03 ENCOUNTER — Ambulatory Visit (INDEPENDENT_AMBULATORY_CARE_PROVIDER_SITE_OTHER): Payer: Medicare Other | Admitting: Family

## 2022-10-03 ENCOUNTER — Encounter (HOSPITAL_BASED_OUTPATIENT_CLINIC_OR_DEPARTMENT_OTHER): Payer: Self-pay | Admitting: Family

## 2022-10-03 ENCOUNTER — Other Ambulatory Visit (INDEPENDENT_AMBULATORY_CARE_PROVIDER_SITE_OTHER): Payer: 59

## 2022-10-03 VITALS — BP 152/74 | HR 79 | Ht 65.0 in | Wt 226.0 lb

## 2022-10-03 DIAGNOSIS — I452 Bifascicular block: Secondary | ICD-10-CM

## 2022-10-03 DIAGNOSIS — I25118 Atherosclerotic heart disease of native coronary artery with other forms of angina pectoris: Secondary | ICD-10-CM | POA: Diagnosis not present

## 2022-10-03 DIAGNOSIS — R42 Dizziness and giddiness: Secondary | ICD-10-CM

## 2022-10-03 DIAGNOSIS — I350 Nonrheumatic aortic (valve) stenosis: Secondary | ICD-10-CM | POA: Diagnosis not present

## 2022-10-03 DIAGNOSIS — E785 Hyperlipidemia, unspecified: Secondary | ICD-10-CM | POA: Diagnosis not present

## 2022-10-03 DIAGNOSIS — I10 Essential (primary) hypertension: Secondary | ICD-10-CM

## 2022-10-03 MED ORDER — AMLODIPINE BESYLATE 5 MG PO TABS: 5.0000 mg | ORAL_TABLET | Freq: Every day | ORAL | 3 refills | Status: DC

## 2022-10-03 MED ORDER — MECLIZINE HCL 25 MG PO TABS: 25.0000 mg | ORAL_TABLET | Freq: Three times a day (TID) | ORAL | 0 refills | Status: DC | PRN

## 2022-10-03 NOTE — Patient Instructions (Addendum)
Medication Instructions:  Your physician has recommended you make the following change in your medication:    START Meclizine three times daily as needed for dizziness  START Amlodipine one 5mg  tablet daily in the evening   *If you need a refill on your cardiac medications before your next appointment, please call your pharmacy*   Lab Work/Testing/Procedures: Your EKG shows bifascicular heart block this means the electrical pathways in the bottom chamber of the heart are moving slow. We will make sure there are no dangerous heart rhythms or prolonged pauses causing your lightheadedness or dizziness by having you wear a monitor.    Your physician has recommended that you wear a Zio monitor.   This monitor is a medical device that records the heart's electrical activity. Doctors most often use these monitors to diagnose arrhythmias. Arrhythmias are problems with the speed or rhythm of the heartbeat. The monitor is a small device applied to your chest. You can wear one while you do your normal daily activities. While wearing this monitor if you have any symptoms to push the button and record what you felt. Once you have worn this monitor for the period of time provider prescribed (Usually 14 days), you will return the monitor device in the postage paid box. Once it is returned they will download the data collected and provide [place] with a report which the provider will then review and we will call you with those results. Important tips:  Avoid showering during the first 24 hours of wearing the monitor. Avoid excessive sweating to help maximize wear time. Do not submerge the device, no hot tubs, and no swimming pools. Keep any lotions or oils away from the patch. After 24 hours you may shower with the patch on. Take brief showers with your back facing the shower head.  Do not remove patch once it has been placed because that will interrupt data and decrease adhesive wear time. Push the button when  you have any symptoms and write down what you were feeling. Once you have completed wearing your monitor, remove and place into box which has postage paid and place in your outgoing mailbox.  If for some reason you have misplaced your box then call our office and we can provide another box and/or mail it off for you.  Follow-Up: At [facility], you and your health needs are our priority.  As part of our continuing mission to provide you with exceptional heart care, we have created designated Provider Care Teams.  These Care Teams include your primary Cardiologist (physician) and Advanced Practice Providers (APPs -  Physician Assistants and Nurse Practitioners) who all work together to provide you with the care you need, when you need it.  We recommend signing up for the patient portal called "MyChart".  Sign up information is provided on this After Visit Summary.  MyChart is used to connect with patients for Virtual Visits (Telemedicine).  Patients are able to view lab/test results, encounter notes, upcoming appointments, etc.  Non-urgent messages can be sent to your provider as well.   To learn more about what you can do with MyChart, go to [email or web address].    Your next appointment:   6 week(s)  The format for your next appointment:   In Person  Provider:   You may see Carlyle Dolly, MD or one of the following Advanced Practice Providers on your designated Care Team:   Loel Dubonnet, NP  Bernerd Pho, PA-C  Ermalinda Barrios, [place]  Other Instructions  Heart Healthy Diet Recommendations: A low-salt diet is recommended. Meats should be grilled, baked, or boiled. Avoid fried foods. Focus on lean protein sources like fish or chicken with vegetables and fruits. The American Heart Association is a Microbiologist!  American Heart Association Diet and Lifeystyle Recommendations   Exercise recommendations: The American Heart Association recommends 150 minutes of  moderate intensity exercise weekly. Try 30 minutes of moderate intensity exercise 4-5 times per week. This could include walking, jogging, or swimming.

## 2022-10-03 NOTE — Progress Notes (Signed)
Office Visit    Patient Name: Jenna Allen Date of Encounter: 10/03/2022  PCP:  Curly Rim, MD   [place]  Cardiologist:  Carlyle Dolly, MD  Advanced Practice Provider:  No care team member to display Electrophysiologist:  None     Chief Complaint    Jenna Allen is a 77 y.o. 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    h/o MI in 1997, s/p PCI x3 stents in 1997, 2 stents in 2000   Essential hypertension    GAD (generalized anxiety disorder)    GERD (gastroesophageal reflux disease)    Mixed hyperlipidemia    Myocardial infarction ([place]) 1997   Nonrheumatic aortic (valve) stenosis    S/P coronary artery stent placement    Past Surgical History:  Procedure Laterality Date   ABDOMINAL HYSTERECTOMY  1988   LAPAROSCOPIC GASTROTOMY W/ REPAIR OF ULCER  1992   LAPAROSCOPIC OOPHERECTOMY     PERCUTANEOUS CORONARY STENT INTERVENTION (PCI-S)     h/o MI in 1997, s/p PCI x3 stents in 1997, 2 stents in 2000    Allergies  Allergies  Allergen Reactions   Erythromycin Other (See Comments)    Jaundice   Levocetirizine Shortness Of Breath    "choking, can't breathe"   Rosuvastatin Nausea And Vomiting    Other reaction(s): Abdominal Pain, Bleeding, Constipation, Cough, Dizziness   Fosamax [Alendronate] Other (See Comments)    Muscle spasm     History of Present Illness    Jenna Allen is a 77 y.o. female with a hx of CAD (MI in 1997 s/p PCI X3, 2 stents in 2000), hyperlipidemia, aortic stenosis carotid bruit (02/2021 carotid duplex no stenosis), hypertension last seen 06/13/2022.  Prior Myoview 12/2020 at [facility] with no ischemia.  Previously stopped amlodipine, Imdur, carvedilol, irbesartan due to orthostatic dizziness.    Echocardiogram  January 2022 LVEF 60%, moderate aortic stenosis with mean gradient of 24.  Last saw Dr. Harl Bowie 06/13/2022 noting 3-week history of dizziness with standing.  She had stopped taking her blood pressure medicines over a week prior to appointment.  Overall at improved off blood pressure medicines and was recommended to increase hydration.  Since last seen still notes a lot of dizziness. She is following with neurology to find out what is going on. She has orthopedic issues in her hands and knees and has had shots. She has seen improvement in pain since injections. Described episodes of lightheadedness that progress to dizziness. Last Friday while she was throwing up and felt she might pass out. She checks BP at home with readings routinely 140s-150s/70s. She no longer drinks caffeine. She smokes 0.5 PPD.   EKGs/Labs/Other Studies Reviewed:   The following studies were reviewed today:  [facility] data Cardiac echo from March, 2022 revealed  normal LV systolic function with EF 60 to 65%,  normal LV wall motion,  moderate LV diastolic dysfunction,  aortic valve gradients are moderately elevated, with peak and mean gradients of 37.000 and 20.000 mmHg. Peak velocity 3 m/s. Dimensionless index 0.40.  Calculated aortic valve area 1.27 cm.  Moderate AS. Trace AI. RVSP is mildly elevated - about 38 mmHg.  Nuclear stress test from April, 2022 revealed  no evidence of ischemia.  Probable small anterior scar, but breast  attenuation was noted at rest and after Lexiscan infusion and could mildly decrease specificity of this study.  Pt has h/o MI in 1997. Normal left ventricular systolic function and normal wall motion.  It was low risk stress test.  Stress echo from March 2019-no evidence of ischemia. Functional capacity 5 METS. Electrically negative. No chest pain.  Patient achieved 82% of maximal predicted heart rate. Normal LV systolic function with EF 55 to 60%. Mild aortic stenosis.  Nuclear stress  test from April 2019 Equivocal cardiac perfusion exam for minimal apical ischemia.  Prognostically this is a low risk scan as per report. Normal LVEF 69%. Normal LV wall motion.  Chest CTA from November 15, 2020 1. No acute pulmonary embolus. 2. Lungs are clear. 3. 3.4 cm right adrenal nodule could be a lipid poor adenoma but is indeterminate. 4. Coronary artery calcifications.  CT chest in November 22, 2020- Patent central airways. No pleural effusion or pneumothorax. 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 Severe coronary calcifications. A 3.3 x 2.5 cm right adrenal nodule.  Carotid doppler from June, 2022 was negative.    Jan 2023 echo Mitral Valve: There is moderate posterior annular calcification.    Mitral Valve: Mitral valve structure is normal. The leaflets are mildly  thickened and exhibit normal excursion.    Aortic Valve: There is moderate stenosis, with peak and mean gradients  of 43.000 and 24.000 mmHg.  Peak velocity 3.3 m/s.  Dimensionless index  0.4.  Calculated aortic valve area 1.25 cm.    Aortic Valve: Mild aortic valve regurgitation.    Aortic Valve: The aortic valve is tricuspid. The leaflets are  moderately thickened and exhibit moderately reduced excursion. The  leaflets are moderately calcified.    Left Ventricle: Doppler parameters are indeterminate for diastolic  function.    Left Ventricle: There is mild concentric hypertrophy.    Left Ventricle: Systolic function is normal. EF: 57-60%. Quantitative  analysis of left ventricular Global Longitudinal Strain (GLS) imaging is  -14.600%.    Left Ventricle: Wall motion is normal.    Tricuspid Valve: The right ventricular systolic pressure is normal (<36  mmHg).   EKG:  EKG is  ordered today.  The ekg ordered today demonstrates NSR 79 bpm with right bundle branch block and left anterior fascicular block.  No acute ST/T wave  changes.  Recent Labs: No results found for requested labs within last 365 days.  Recent Lipid Panel No results found for: "CHOL", "TRIG", "HDL", "CHOLHDL", "VLDL", "LDLCALC", "LDLDIRECT"  Home Medications   Current Meds  Medication Sig   albuterol (VENTOLIN HFA) 108 (90 Base) MCG/ACT inhaler Inhale 2 puffs into the lungs every 6 (six) hours as needed.   amLODipine (NORVASC) 5 MG tablet Take 1 tablet (5 mg total) by mouth daily.   aspirin EC 81 MG tablet Take 81 mg by mouth daily. Swallow whole.   atorvastatin (LIPITOR) 80 MG tablet Take 80 mg by mouth daily.   benzonatate (TESSALON) 100 MG capsule Take 100 mg by mouth 3 (three) times daily as needed.   clindamycin (CLINDAGEL) 1 % gel Apply topically 2 (two) times daily as needed.   Cyanocobalamin (VITAMIN B-12 IJ) Inject as directed every 30 (thirty) days.   ergocalciferol (VITAMIN D2) 1.25 MG (50000 UT) capsule Take 50,000 Units by mouth once a week.   famotidine (PEPCID) 40 MG tablet Take 1 tablet (40 mg total) by mouth at bedtime.   furosemide (LASIX)  20 MG tablet Take 20 mg by mouth daily as needed.   ibuprofen (ADVIL) 200 MG tablet Take 200 mg by mouth every 6 (six) hours as needed.   LORazepam (ATIVAN) 0.5 MG tablet Take 0.5 mg by mouth at bedtime.   meclizine (ANTIVERT) 25 MG tablet Take 1 tablet (25 mg total) by mouth 3 (three) times daily as needed for dizziness.   nitroGLYCERIN (NITROSTAT) 0.4 MG SL tablet Place 0.4 mg under the tongue as needed for chest pain.   omeprazole (PRILOSEC) 40 MG capsule Take 30- 60 min before your first and last meals of the day    Review of Systems      All other systems reviewed and are otherwise negative except as noted above.  Physical Exam    VS:  BP (!) 152/74   Pulse 79   Ht 5\' 5"  (1.651 m)   Wt 226 lb (102.5 kg)   BMI 37.61 kg/m  , BMI Body mass index is 37.61 kg/m.  Wt Readings from Last 3 Encounters:  10/03/22 226 lb (102.5 kg)  06/13/22 228 lb 3.2 oz (103.5 kg)  04/02/22  231 lb (104.8 kg)     GEN: Well nourished, well developed, in no acute distress. HEENT: normal. Neck: Supple, no JVD, carotid bruits, or masses. Cardiac: RRR, no murmurs, rubs, or gallops. No clubbing, cyanosis, edema.  Radials/PT 2+ and equal bilaterally.  Respiratory:  Respirations regular and unlabored, clear to auscultation bilaterally. GI: Soft, nontender, nondistended. MS: No deformity or atrophy. Skin: Warm and dry, no rash. Neuro:  Strength and sensation are intact. Psych: Normal affect.  Assessment & Plan    CAD - Stable with no anginal symptoms. No indication for ischemic evaluation.  GDMT includes aspirin, atorvastatin.  No beta-blocker due to bifascicular block.  Interested in being more active-referred to prep exercise program.  HLD, LDL goal <70 -LDL at goal.  Continue atorvastatin.  Aortic stenosis - moderate by echo 09/2021.  Recommend optimal BP control, as below.  Consider repeat echocardiogram 09/2022 for monitoring.  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  Symptoms of spinning consistent with vertigo.  Recommend meclizine 3 times daily as needed.  She is following with neurology.  Given episodes of near syncope and bifascicular block will place 14-day ZIO in clinic today to rule out complete heart block or other arrhythmia as contributory.  HTN -BP not at goal less than 130/80.  Previous orthostatic hypotension.  Careful titration of antihypertensive regimen.  Start amlodipine 5 mg nightly.    Disposition: Follow up in 6 week(s) with June 2022, MD or APP.  Signed, Dina Rich, NP 10/03/2022, 4:13 PM [facility]

## 2022-10-03 NOTE — Addendum Note (Signed)
Addended by: Gerald Stabs on: 10/03/2022 04:29 PM   Modules accepted: Orders

## 2022-10-07 ENCOUNTER — Telehealth: Payer: Self-pay | Admitting: *Deleted

## 2022-10-07 ENCOUNTER — Ambulatory Visit (HOSPITAL_BASED_OUTPATIENT_CLINIC_OR_DEPARTMENT_OTHER): Payer: Medicare Other | Admitting: Family

## 2022-10-07 NOTE — Telephone Encounter (Signed)
Contacted regarding PREP Class referral. Left voice message for return call for more information. 

## 2022-10-14 ENCOUNTER — Telehealth (HOSPITAL_BASED_OUTPATIENT_CLINIC_OR_DEPARTMENT_OTHER): Payer: Self-pay | Admitting: Family

## 2022-10-14 DIAGNOSIS — R42 Dizziness and giddiness: Secondary | ICD-10-CM

## 2022-10-14 MED ORDER — MECLIZINE HCL 25 MG PO TABS: 25.0000 mg | ORAL_TABLET | Freq: Three times a day (TID) | ORAL | 0 refills | Status: AC | PRN

## 2022-10-14 NOTE — Telephone Encounter (Signed)
*  STAT* If patient is at the pharmacy, call can be transferred to refill team.   1. Which medications need to be refilled? (please list name of each medication and dose if known) meclizine (ANTIVERT) 25 MG tablet   2. Which pharmacy/location (including street and city if local pharmacy) is medication to be sent to?  [facility] [place], [address]    3. Do they need a 30 day or 90 day supply? 90 day supply  Only has two tablets left.

## 2022-10-14 NOTE — Telephone Encounter (Signed)
Rx request sent to pharmacy.  

## 2022-10-22 ENCOUNTER — Telehealth: Payer: Self-pay | Admitting: *Deleted

## 2022-10-22 NOTE — Telephone Encounter (Signed)
Second message left regarding PREP Class referral. Left voice message to return call for more information.

## 2022-10-22 NOTE — Telephone Encounter (Signed)
Returned my call regarding PREP Class. Interested in participating at the [facility] in March 2024. Will call her back with availability.

## 2022-11-14 ENCOUNTER — Ambulatory Visit (INDEPENDENT_AMBULATORY_CARE_PROVIDER_SITE_OTHER): Payer: 59 | Admitting: Family

## 2022-11-14 ENCOUNTER — Encounter (HOSPITAL_BASED_OUTPATIENT_CLINIC_OR_DEPARTMENT_OTHER): Payer: Self-pay | Admitting: Family

## 2022-11-14 VITALS — BP 128/82 | HR 70 | Ht 65.0 in | Wt 226.0 lb

## 2022-11-14 DIAGNOSIS — I350 Nonrheumatic aortic (valve) stenosis: Secondary | ICD-10-CM

## 2022-11-14 DIAGNOSIS — R42 Dizziness and giddiness: Secondary | ICD-10-CM | POA: Diagnosis not present

## 2022-11-14 DIAGNOSIS — I25118 Atherosclerotic heart disease of native coronary artery with other forms of angina pectoris: Secondary | ICD-10-CM | POA: Diagnosis not present

## 2022-11-14 DIAGNOSIS — E785 Hyperlipidemia, unspecified: Secondary | ICD-10-CM | POA: Diagnosis not present

## 2022-11-14 DIAGNOSIS — Z6837 Body mass index (BMI) 37.0-37.9, adult: Secondary | ICD-10-CM

## 2022-11-14 NOTE — Patient Instructions (Signed)
Medication Instructions:  Can try Flonase if you wish for runny nose.   *If you need a refill on your cardiac medications before your next appointment, please call your pharmacy*   Lab Work/Testing/Procedures: Your physician has requested that you have an echocardiogram. Echocardiography is a painless test that uses sound waves to create images of your heart. It provides your doctor with information about the size and shape of your heart and how well your heart's chambers and valves are working. This procedure takes approximately one hour. There are no restrictions for this procedure. Please do NOT wear cologne, perfume, aftershave, or lotions (deodorant is allowed). Please arrive 15 minutes prior to your appointment time.    Follow-Up: At [facility], you and your health needs are our priority.  As part of our continuing mission to provide you with exceptional heart care, we have created designated Provider Care Teams.  These Care Teams include your primary Cardiologist (physician) and Advanced Practice Providers (APPs -  Physician Assistants and Nurse Practitioners) who all work together to provide you with the care you need, when you need it.  We recommend signing up for the patient portal called "MyChart".  Sign up information is provided on this After Visit Summary.  MyChart is used to connect with patients for Virtual Visits (Telemedicine).  Patients are able to view lab/test results, encounter notes, upcoming appointments, etc.  Non-urgent messages can be sent to your provider as well.   To learn more about what you can do with MyChart, go to [email or web address].    Your next appointment:   3-4 months  Provider:   You may see Carlyle Dolly, MD or Loel Dubonnet, NP   Other Instructions  Heart Healthy Diet Recommendations: A low-salt diet is recommended. Meats should be grilled, baked, or boiled. Avoid fried foods. Focus on lean protein sources like fish or chicken  with vegetables and fruits. The American Heart Association is a Microbiologist!  American Heart Association Diet and Lifeystyle Recommendations   Exercise recommendations: The American Heart Association recommends 150 minutes of moderate intensity exercise weekly. Try 30 minutes of moderate intensity exercise 4-5 times per week. This could include walking, jogging, or swimming.

## 2022-11-14 NOTE — Progress Notes (Addendum)
Office Visit    Patient Name: Jenna Allen Date of Encounter: 11/14/2022  PCP:  Vivien Presto, MD   [facility]  Cardiologist:  Dina Rich, MD  Advanced Practice Provider:  No care team member to display Electrophysiologist:  None     Chief Complaint    Jenna Allen is a 77 y.o. female presents today for follow-up after monitor  Past Medical History    Past Medical History:  Diagnosis Date   Bilateral carotid bruits    Coronary artery disease with history of myocardial infarction without history of CABG    h/o MI in 1997, s/p PCI x3 stents in 1997, 2 stents in 2000   Essential hypertension    GAD (generalized anxiety disorder)    GERD (gastroesophageal reflux disease)    Mixed hyperlipidemia    Myocardial infarction (HCC) 1997   Nonrheumatic aortic (valve) stenosis    S/P coronary artery stent placement    Past Surgical History:  Procedure Laterality Date   ABDOMINAL HYSTERECTOMY  1988   LAPAROSCOPIC GASTROTOMY W/ REPAIR OF ULCER  1992   LAPAROSCOPIC OOPHERECTOMY     PERCUTANEOUS CORONARY STENT INTERVENTION (PCI-S)     h/o MI in 1997, s/p PCI x3 stents in 1997, 2 stents in 2000    Allergies  Allergies  Allergen Reactions   Erythromycin Other (See Comments)    Jaundice   Levocetirizine Shortness Of Breath    "choking, can't breathe"   Rosuvastatin Nausea And Vomiting    Other reaction(s): Abdominal Pain, Bleeding, Constipation, Cough, Dizziness   Fosamax [Alendronate] Other (See Comments)    Muscle spasm     History of Present Illness    Jenna Allen is a 77 y.o. female with a hx of CAD (MI in 1997 s/p PCI X3, 2 stents in 2000), hyperlipidemia, aortic stenosis carotid bruit (02/2021 carotid duplex no stenosis), hypertension last seen 10/03/22  Prior Myoview 12/2020 at [facility] with no ischemia.  Previously stopped amlodipine, Imdur, carvedilol, irbesartan due to orthostatic dizziness.    Echocardiogram January 2022  LVEF 60%, moderate aortic stenosis with mean gradient of 24.  Saw Dr. Wyline Mood 06/13/2022 noting 3-week history of dizziness with standing.  She had stopped taking her blood pressure medicines over a week prior to appointment.  Overall at improved off blood pressure medicines and was recommended to increase hydration.  She was last seen 10/03/22 noting dizziness consistent with vertigo recommended for Meclizine. She was additionally following with neurology. Given elevated BP, Amlodipine 5mg  QD added. 14 day ZIO placed in clinic due to known bifascicular block.  ZIO preliminary report with predominant rhythm normal sinus rhythm average heart rate 73 bpm.  6 episodes of SVT fastest 5 beats max rate 162 bpm and longest 11 beats at 135 bpm. She did have one 3.2 second pause at 11:38 AM. She had second degree AV block (Wenckebach) by monitor report however reviewed with Dr. Duke Salvia in clinic and appears to be blocked PAC's in pattern of bigeminy.  She presents today for follow up independently. Notes some stressors as she is having to assisting caretaking for multiple family members. Notes after our last office visit she started her OTC Meclizine three times per day with improvement in symptoms but still occasional dizziness. She went to see her eye doctor for routine visit who recommended she try 24 hour Allegra. After starting Allegra her pressure around her sinus and her dizziness is improved and no longer requiring as needed meclizine.  BP at home 120s-140s.  Reports no chest pain, pressure, tightness.  She is excited to start the PREP exercise program in March to increase her activity.  EKGs/Labs/Other Studies Reviewed:   The following studies were reviewed today:  [facility] data Cardiac echo from March, 2022 revealed  normal LV systolic function with EF 60 to 65%,  normal LV wall motion,  moderate LV diastolic dysfunction,  aortic valve gradients are moderately elevated, with peak and mean gradients of  37.000 and 20.000 mmHg. Peak velocity 3 m/s. Dimensionless index 0.40.  Calculated aortic valve area 1.27 cm.  Moderate AS. Trace AI. RVSP is mildly elevated - about 38 mmHg.  Nuclear stress test from April, 2022 revealed  no evidence of ischemia.  Probable small anterior scar, but breast attenuation was noted at rest and after Lexiscan infusion and could mildly decrease specificity of this study.  Pt has h/o MI in 1997. Normal left ventricular systolic function and normal wall motion.  It was low risk stress test.  Stress echo from March 2019-no evidence of ischemia. Functional capacity 5 METS. Electrically negative. No chest pain.  Patient achieved 82% of maximal predicted heart rate. Normal LV systolic function with EF 55 to 60%. Mild aortic stenosis.  Nuclear stress test from April 2019 Equivocal cardiac perfusion exam for minimal apical ischemia.  Prognostically this is a low risk scan as per report. Normal LVEF 69%. Normal LV wall motion.  Chest CTA from November 15, 2020 1. No acute pulmonary embolus. 2. Lungs are clear. 3. 3.4 cm right adrenal nodule could be a lipid poor adenoma but is indeterminate. 4. Coronary artery calcifications.  CT chest in November 22, 2020- Patent central airways. No pleural effusion or pneumothorax. 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 Severe coronary calcifications. A 3.3 x 2.5 cm right adrenal nodule.  Carotid doppler from June, 2022 was negative.    Jan 2023 echo Mitral Valve: There is moderate posterior annular calcification.    Mitral Valve: Mitral valve structure is normal. The leaflets are mildly  thickened and exhibit normal excursion.    Aortic Valve: There is moderate stenosis, with peak and mean gradients  of 43.000 and 24.000 mmHg.  Peak velocity 3.3 m/s.  Dimensionless index  0.4.  Calculated aortic valve area 1.25 cm.    Aortic Valve: Mild aortic  valve regurgitation.    Aortic Valve: The aortic valve is tricuspid. The leaflets are  moderately thickened and exhibit moderately reduced excursion. The  leaflets are moderately calcified.    Left Ventricle: Doppler parameters are indeterminate for diastolic  function.    Left Ventricle: There is mild concentric hypertrophy.    Left Ventricle: Systolic function is normal. EF: 57-60%. Quantitative  analysis of left ventricular Global Longitudinal Strain (GLS) imaging is  -14.600%.    Left Ventricle: Wall motion is normal.    Tricuspid Valve: The right ventricular systolic pressure is normal (<36  mmHg).   EKG:  EKG is not  ordered today.    Recent Labs: No results found for requested labs within last 365 days.  Recent Lipid Panel No results found for: "CHOL", "TRIG", "HDL", "CHOLHDL", "VLDL", "LDLCALC", "LDLDIRECT"  Home Medications   Current Meds  Medication Sig   albuterol (VENTOLIN HFA) 108 (90 Base) MCG/ACT inhaler Inhale 2 puffs into the lungs every 6 (six) hours as needed.   amLODipine (NORVASC) 5 MG tablet Take 1 tablet (5 mg total) by mouth daily.   aspirin  EC 81 MG tablet Take 81 mg by mouth daily. Swallow whole.   atorvastatin (LIPITOR) 80 MG tablet Take 80 mg by mouth daily.   benzonatate (TESSALON) 100 MG capsule Take 100 mg by mouth 3 (three) times daily as needed.   clindamycin (CLINDAGEL) 1 % gel Apply topically 2 (two) times daily as needed.   Cyanocobalamin (VITAMIN B-12 IJ) Inject as directed every 30 (thirty) days.   ergocalciferol (VITAMIN D2) 1.25 MG (50000 UT) capsule Take 50,000 Units by mouth once a week.   famotidine (PEPCID) 40 MG tablet Take 1 tablet (40 mg total) by mouth at bedtime.   furosemide (LASIX) 20 MG tablet Take 20 mg by mouth daily as needed.   ibuprofen (ADVIL) 200 MG tablet Take 200 mg by mouth every 6 (six) hours as needed.   LORazepam (ATIVAN) 0.5 MG tablet Take 0.5 mg by mouth at bedtime.   meclizine (ANTIVERT) 25 MG tablet Take 1  tablet (25 mg total) by mouth 3 (three) times daily as needed for dizziness.   nitroGLYCERIN (NITROSTAT) 0.4 MG SL tablet Place 0.4 mg under the tongue as needed for chest pain.   omeprazole (PRILOSEC) 40 MG capsule Take 30- 60 min before your first and last meals of the day    Review of Systems      All other systems reviewed and are otherwise negative except as noted above.  Physical Exam    VS:  BP 128/82   Pulse 70   Ht 5\' 5"  (1.651 m)   Wt 226 lb (102.5 kg)   BMI 37.61 kg/m  , BMI Body mass index is 37.61 kg/m.  Wt Readings from Last 3 Encounters:  11/14/22 226 lb (102.5 kg)  10/03/22 226 lb (102.5 kg)  06/13/22 228 lb 3.2 oz (103.5 kg)     GEN: Well nourished, well developed, in no acute distress. HEENT: normal. Neck: Supple, no JVD, carotid bruits, or masses. Cardiac: RRR, no murmurs, rubs, or gallops. No clubbing, cyanosis, edema.  Radials/PT 2+ and equal bilaterally.  Respiratory:  Respirations regular and unlabored, clear to auscultation bilaterally. GI: Soft, nontender, nondistended. MS: No deformity or atrophy. Skin: Warm and dry, no rash. Neuro:  Strength and sensation are intact. Psych: Normal affect.  Assessment & Plan    CAD - Stable with no anginal symptoms. No indication for ischemic evaluation.  GDMT includes aspirin, atorvastatin.  No beta-blocker due to bifascicular block.    HLD, LDL goal <70 -LDL at goal.  Continue atorvastatin.  Aortic stenosis - moderate by echo 09/2021.  Recommend optimal BP control, as below.Update echo for monitoring.   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  Prior visit 10/03/22 vertigo like symptoms improved with Meclizine and resolved with addition of Allegra. No near syncope, syncope. Dizziness resolved since addition of Allegra. Monitor with predominantly NSR one 3.2 second pause at 11am (during waking hours) which was not triggered. As symptoms resolve, will defer  further workup at this time. Discussed with Dr. Wyline Mood who agrees with plan to monitor.  HTN - BP well controlled. Continue current antihypertensive regimen of Amlodipine 5mg  QD.  BMI 37 - Weight loss via diet and exercise encouraged. Discussed the impact being overweight would have on cardiovascular risk. Plans to participate in PREP exercise program at [facility] in March.    Disposition: Follow up in 6 week(s) with Dina Rich, MD or APP.  Signed, Alver Sorrow, NP 11/14/2022, 4:58 PM [facility]

## 2022-11-18 ENCOUNTER — Other Ambulatory Visit (HOSPITAL_BASED_OUTPATIENT_CLINIC_OR_DEPARTMENT_OTHER): Payer: Self-pay

## 2022-11-18 ENCOUNTER — Other Ambulatory Visit (HOSPITAL_BASED_OUTPATIENT_CLINIC_OR_DEPARTMENT_OTHER): Payer: Self-pay | Admitting: Family

## 2022-11-18 ENCOUNTER — Ambulatory Visit (INDEPENDENT_AMBULATORY_CARE_PROVIDER_SITE_OTHER): Payer: 59

## 2022-11-18 DIAGNOSIS — I350 Nonrheumatic aortic (valve) stenosis: Secondary | ICD-10-CM | POA: Diagnosis not present

## 2022-11-18 DIAGNOSIS — R42 Dizziness and giddiness: Secondary | ICD-10-CM

## 2022-11-18 LAB — ECHOCARDIOGRAM COMPLETE
AR max vel: 0.84 cm2
AV Area VTI: 0.89 cm2
AV Area mean vel: 0.83 cm2
AV Mean grad: 24 mmHg
AV Peak grad: 45.4 mmHg
Ao pk vel: 3.37 m/s
Area-P 1/2: 2.56 cm2
P 1/2 time: 503 msec
S' Lateral: 2.63 cm

## 2022-11-18 MED ORDER — FEXOFENADINE HCL 180 MG PO TABS
180.0000 mg | ORAL_TABLET | Freq: Every day | ORAL | 1 refills | Status: AC
  Filled 2022-11-18: qty 100, 100d supply, fill #0

## 2022-11-18 NOTE — Progress Notes (Signed)
She was seen for echo today and noted dizziness.  On stenographer review she has been out of her Allegra since Friday which is when the dizziness recurred.  Sent prescription for generic Allegra to pharmacy in the building so she can hopefully pick up today.  She also has meclizine to use as needed for breakthrough dizziness.  Loel Dubonnet, NP

## 2022-11-19 ENCOUNTER — Other Ambulatory Visit (HOSPITAL_BASED_OUTPATIENT_CLINIC_OR_DEPARTMENT_OTHER): Payer: Self-pay

## 2022-11-19 DIAGNOSIS — R42 Dizziness and giddiness: Secondary | ICD-10-CM

## 2022-11-19 DIAGNOSIS — I350 Nonrheumatic aortic (valve) stenosis: Secondary | ICD-10-CM

## 2022-11-19 DIAGNOSIS — I25118 Atherosclerotic heart disease of native coronary artery with other forms of angina pectoris: Secondary | ICD-10-CM

## 2022-12-04 ENCOUNTER — Telehealth (HOSPITAL_BASED_OUTPATIENT_CLINIC_OR_DEPARTMENT_OTHER): Payer: Self-pay | Admitting: Family

## 2022-12-04 NOTE — Telephone Encounter (Signed)
Spoke with patient regarding dizziness Reviewed visit from PCP, see below from visit   Continues to have dizziness. Has seen both neurology and cardiology with same complaint. Cardiology prescribed meclizine. Does not really seem to be helping symptoms. Was recently evaluated by urology and mentioned most recent ECHO showed further narrowing of aortic stenosis. Recommended f/u with cardiology regarding continued dizziness. No ear pain or sinus symptoms. Reports as long as she's sitting dizziness is not bad. When getting up and starts walking feels like she is walking drunk. No syncope but does reported to urology near syncopal episodes changing positions. Reports she has appointment with cariology next week.    Instructed pt to follow up with cardiology about continued dizziness. If not cardiac cause, I recommend f/u with neurology. May need brain imaging.   Explained to patient MRI could not be done without visit first  Did advise to go ahead and call neurology for visit  Scheduled her appointment with Overton Mam NP 3/18

## 2022-12-04 NOTE — Telephone Encounter (Signed)
New Message:     Ptient said she saw her primary doctor.She said she was told to see Laurann Montana, because she was having dizziness.  The first available appointment with any APP is March 28,2024.    STAT if patient feels like he/she is going to faint   Are you dizzy now? yes  Do you feel faint or have you passed out? Sometimes she does  Do you have any other symptoms? no  Have you checked your HR and BP (record if available)? Yes, it was good- patient said her primary doctor said she needs a MRI. She wonder if a MRI could be ordered before her appointment?

## 2022-12-06 NOTE — Telephone Encounter (Signed)
Echo with moderate aortic stenosis - low suspicion this is significant enough to cause symptoms. Her previous dizziness was more a spinning sensation.   Her dizziness previously resolved with Allegra - has she discontinued the Allegra?  Will further evaluate in clinic. Would recommend follow up with neurology for possible brain imaging if needed.  Loel Dubonnet, NP

## 2022-12-08 NOTE — Telephone Encounter (Signed)
Spoke with patient and she stopped the Allegra secondary to shortness of breath  Will keep her visit as scheduled  Stated she has already reached out to Neurology

## 2022-12-08 NOTE — Telephone Encounter (Signed)
Patient is returning call.  °

## 2022-12-08 NOTE — Telephone Encounter (Signed)
Left message to call back  

## 2022-12-10 NOTE — Telephone Encounter (Signed)
Low suspicion Allegra would cause dyspnea. Will address at clinic visit.   Loel Dubonnet, NP

## 2022-12-11 ENCOUNTER — Ambulatory Visit: Payer: Medicare Other | Admitting: Cardiology

## 2022-12-15 ENCOUNTER — Ambulatory Visit (INDEPENDENT_AMBULATORY_CARE_PROVIDER_SITE_OTHER): Payer: 59 | Admitting: Family

## 2022-12-15 ENCOUNTER — Encounter (HOSPITAL_BASED_OUTPATIENT_CLINIC_OR_DEPARTMENT_OTHER): Payer: Self-pay | Admitting: Family

## 2022-12-15 VITALS — BP 130/60 | HR 54 | Ht 65.0 in | Wt 227.0 lb

## 2022-12-15 DIAGNOSIS — I10 Essential (primary) hypertension: Secondary | ICD-10-CM

## 2022-12-15 DIAGNOSIS — I452 Bifascicular block: Secondary | ICD-10-CM

## 2022-12-15 DIAGNOSIS — I25118 Atherosclerotic heart disease of native coronary artery with other forms of angina pectoris: Secondary | ICD-10-CM

## 2022-12-15 DIAGNOSIS — E785 Hyperlipidemia, unspecified: Secondary | ICD-10-CM

## 2022-12-15 DIAGNOSIS — I350 Nonrheumatic aortic (valve) stenosis: Secondary | ICD-10-CM

## 2022-12-15 DIAGNOSIS — R42 Dizziness and giddiness: Secondary | ICD-10-CM | POA: Diagnosis not present

## 2022-12-15 DIAGNOSIS — Z6837 Body mass index (BMI) 37.0-37.9, adult: Secondary | ICD-10-CM

## 2022-12-15 NOTE — Patient Instructions (Addendum)
Medication Instructions:  Your physician has recommended you make the following change in your medication:    May trial Allegra once per day to see if it helps reduce sinus inflammation. If you are not comfortable taking this, could try Claritin once per day instead.  Take Furosemide (Lasix) daily for 2 days then after that take once per week.   *If you need a refill on your cardiac medications before your next appointment, please call your pharmacy*   Lab Work/Testing/Procedures: Your physician recommends that you return for lab work in: 2 weeks for BMP   Follow-Up: At [facility], you and your health needs are our priority.  As part of our continuing mission to provide you with exceptional heart care, we have created designated Provider Care Teams.  These Care Teams include your primary Cardiologist (physician) and Advanced Practice Providers (APPs -  Physician Assistants and Nurse Practitioners) who all work together to provide you with the care you need, when you need it.  We recommend signing up for the patient portal called "MyChart".  Sign up information is provided on this After Visit Summary.  MyChart is used to connect with patients for Virtual Visits (Telemedicine).  Patients are able to view lab/test results, encounter notes, upcoming appointments, etc.  Non-urgent messages can be sent to your provider as well.   To learn more about what you can do with MyChart, go to [email or web address].    Your next appointment:   As scheduled with Loel Dubonnet, NP   Other Instructions  Recommend weighing daily and keeping a log. Please call our office if you have weight gain of 2 pounds overnight or 5 pounds in 1 week.   Date  Time Weight

## 2022-12-15 NOTE — Progress Notes (Signed)
Office Visit    Patient Name: Jenna Allen Date of Encounter: 12/15/2022  PCP:  Curly Rim, MD   [place]  Cardiologist:  Carlyle Dolly, MD  Advanced Practice Provider:  No care team member to display Electrophysiologist:  None     Chief Complaint    Jenna Allen is a 77 y.o. female presents today for dizziness  Past Medical History    Past Medical History:  Diagnosis Date   Bilateral carotid bruits    Coronary artery disease with history of myocardial infarction without history of CABG    h/o MI in 1997, s/p PCI x3 stents in 1997, 2 stents in 2000   Essential hypertension    GAD (generalized anxiety disorder)    GERD (gastroesophageal reflux disease)    Mixed hyperlipidemia    Myocardial infarction ([place]) 1997   Nonrheumatic aortic (valve) stenosis    S/P coronary artery stent placement    Past Surgical History:  Procedure Laterality Date   ABDOMINAL HYSTERECTOMY  1988   LAPAROSCOPIC GASTROTOMY W/ REPAIR OF ULCER  1992   LAPAROSCOPIC OOPHERECTOMY     PERCUTANEOUS CORONARY STENT INTERVENTION (PCI-S)     h/o MI in 1997, s/p PCI x3 stents in 1997, 2 stents in 2000    Allergies  Allergies  Allergen Reactions   Erythromycin Other (See Comments)    Jaundice   Levocetirizine Shortness Of Breath    "choking, can't breathe"   Rosuvastatin Nausea And Vomiting    Other reaction(s): Abdominal Pain, Bleeding, Constipation, Cough, Dizziness   Fosamax [Alendronate] Other (See Comments)    Muscle spasm     History of Present Illness    Jenna Allen is a 77 y.o. female with a hx of CAD (MI in 1997 s/p PCI X3, 2 stents in 2000), hyperlipidemia, aortic stenosis carotid bruit (02/2021 carotid duplex no stenosis), hypertension, B12 deficiency last seen 11/14/2022.  Prior Myoview 12/2020 at [facility] with no ischemia.  Previously stopped amlodipine, Imdur, carvedilol, irbesartan due to orthostatic dizziness.    Echocardiogram January  2022 LVEF 60%, moderate aortic stenosis with mean gradient of 24.  Saw Dr. Harl Bowie 06/13/2022 noting 3-week history of dizziness with standing.  She had stopped taking her blood pressure medicines over a week prior to appointment.  Overall at improved off blood pressure medicines and was recommended to increase hydration.  At visit 1-5/24 she noted dizziness consistent with vertigo and recommended to start meclizine.  Amlodipine 5 mg daily was added for elevated blood pressure.  14-day ZIO placed in clinic revealed predominantly normal sinus rhythm 73 bpm, 6 expos SVT fastest 5 beats at 160 bpm and longest 11 beats at 135 bpm.  She had one 3.2-second pause at 11:38 AM.  The monitor reported second-degree AV block however reviewed with MD and was blocked PAC in pattern of bigeminy.  Discussed with Dr. Harl Bowie recommended for monitoring.  At visit 11/14/2022 noted her dizziness had resolved after starting Allegra at the recommendation of her eye doctor.  The meclizine did help with symptoms.  Echocardiogram ordered for monitoring of aortic stenosis.  Echocardiogram 11/18/2022 normal LVEF 60 to 65%, no RWMA, mild LVH, mild aortic regurgitation, moderate aortic valve stenosis (mean gradient 24 mmHg), borderline dilation of aortic root 38 mm, severe calcification of mitral and aortic valves.  She presents today for persistent dizziness.  Dizziness previously resolved when taking Allegra but she self discontinued as she felt as if her heart rate was different and  felt more short of breath and discontinued.  Previously tolerated Claritin and does report history of chronic sinus issues.  She is taking Meclizine three times per day on a scheduled basis - 8AM, 4PM, 8PM. The Meclizine does help her symptoms.   When she is sitting she is fine. When she stands up it is as the room is slowly spinning then it starts to speed up. Reports occasional nausea. Is having to hold onto things to get around her home.  She reports no  near-syncope, syncope syncope.  Tells me she has been sitting more and is feeling she is gaining weight. She does not weigh regularly at home.  Does report exertional dyspnea. Takes Lasix very seldom - last took a month ago.  Reports no lower extremity edema.  Reports no chest pain, pressure, tightness.  She has seen neurology since last August - she last saw neurology 12/10/22 and MRI head ordered as well as vestibular rehab. She is awaiting pre cert for MRI.   EKGs/Labs/Other Studies Reviewed:   The following studies were reviewed today:  Echo 11/18/22  1. Left ventricular ejection fraction, by estimation, is 60 to 65%. The  left ventricle has normal function. The left ventricle has no regional  wall motion abnormalities. There is mild concentric left ventricular  hypertrophy. Diastolic function  indeterminant due to moderate to severe MAC.   2. Right ventricular systolic function is normal. The right ventricular  size is normal. Mildly increased right ventricular wall thickness.   3. Left atrial size was mildly dilated.   4. The mitral valve is degenerative. Trivial mitral valve regurgitation.  No evidence of mitral stenosis. Moderate to severe mitral annular  calcification.   5. The aortic valve is tricuspid. There is severe calcifcation of the  aortic valve. There is severe thickening of the aortic valve. Aortic valve  regurgitation is mild. Moderate aortic valve stenosis. Aortic valve area,  by VTI measures 0.89 cm. Aortic  valve mean gradient measures 24.0 mmHg. Aortic valve Vmax measures 3.37  m/s. DI 0.27.   6. There is borderline dilatation of the aortic root, measuring 38 mm.   7. The inferior vena cava is normal in size with greater than 50%  respiratory variability, suggesting right atrial pressure of 3 mmHg.   Comparison(s): No prior Echocardiogram.   [facility] data Cardiac echo from March, 2022 revealed  normal LV systolic function with EF 60 to 65%,  normal LV wall  motion,  moderate LV diastolic dysfunction,  aortic valve gradients are moderately elevated, with peak and mean gradients of 37.000 and 20.000 mmHg. Peak velocity 3 m/s. Dimensionless index 0.40.  Calculated aortic valve area 1.27 cm.  Moderate AS. Trace AI. RVSP is mildly elevated - about 38 mmHg.  Nuclear stress test from April, 2022 revealed  no evidence of ischemia.  Probable small anterior scar, but breast attenuation was noted at rest and after Lexiscan infusion and could mildly decrease specificity of this study.  Pt has h/o MI in 1997. Normal left ventricular systolic function and normal wall motion.  It was low risk stress test.  Stress echo from March 2019-no evidence of ischemia. Functional capacity 5 METS. Electrically negative. No chest pain.  Patient achieved 82% of maximal predicted heart rate. Normal LV systolic function with EF 55 to 60%. Mild aortic stenosis.  Nuclear stress test from April 2019 Equivocal cardiac perfusion exam for minimal apical ischemia.  Prognostically this is a low risk scan as per report. Normal LVEF 69%. Normal  LV wall motion.  Chest CTA from November 15, 2020 1. No acute pulmonary embolus. 2. Lungs are clear. 3. 3.4 cm right adrenal nodule could be a lipid poor adenoma but is indeterminate. 4. Coronary artery calcifications.  CT chest in November 22, 2020- Patent central airways. No pleural effusion or pneumothorax. 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 Severe coronary calcifications. A 3.3 x 2.5 cm right adrenal nodule.  Carotid doppler from June, 2022 was negative.    Jan 2023 echo Mitral Valve: There is moderate posterior annular calcification.    Mitral Valve: Mitral valve structure is normal. The leaflets are mildly  thickened and exhibit normal excursion.    Aortic Valve: There is moderate stenosis, with peak and mean gradients  of 43.000 and 24.000  mmHg.  Peak velocity 3.3 m/s.  Dimensionless index  0.4.  Calculated aortic valve area 1.25 cm.    Aortic Valve: Mild aortic valve regurgitation.    Aortic Valve: The aortic valve is tricuspid. The leaflets are  moderately thickened and exhibit moderately reduced excursion. The  leaflets are moderately calcified.    Left Ventricle: Doppler parameters are indeterminate for diastolic  function.    Left Ventricle: There is mild concentric hypertrophy.    Left Ventricle: Systolic function is normal. EF: 57-60%. Quantitative  analysis of left ventricular Global Longitudinal Strain (GLS) imaging is  -14.600%.    Left Ventricle: Wall motion is normal.    Tricuspid Valve: The right ventricular systolic pressure is normal (<36  mmHg).   EKG:  EKG is not  ordered today.    Recent Labs: No results found for requested labs within last 365 days.  Recent Lipid Panel No results found for: "CHOL", "TRIG", "HDL", "CHOLHDL", "VLDL", "LDLCALC", "LDLDIRECT"  Home Medications   Current Meds  Medication Sig   amLODipine (NORVASC) 5 MG tablet Take 1 tablet (5 mg total) by mouth daily.   aspirin EC 81 MG tablet Take 81 mg by mouth daily. Swallow whole.   atorvastatin (LIPITOR) 80 MG tablet Take 80 mg by mouth daily.   benzonatate (TESSALON) 100 MG capsule Take 100 mg by mouth 3 (three) times daily as needed.   clindamycin (CLINDAGEL) 1 % gel Apply topically 2 (two) times daily as needed.   Cyanocobalamin (VITAMIN B-12 IJ) Inject as directed every 30 (thirty) days.   ergocalciferol (VITAMIN D2) 1.25 MG (50000 UT) capsule Take 50,000 Units by mouth once a week.   famotidine (PEPCID) 40 MG tablet Take 1 tablet (40 mg total) by mouth at bedtime.   fexofenadine (ALLEGRA) 180 MG tablet Take 1 tablet (180 mg total) by mouth daily.   furosemide (LASIX) 20 MG tablet Take 20 mg by mouth daily as needed.   ibuprofen (ADVIL) 200 MG tablet Take 200 mg by mouth every 6 (six) hours as needed.   LORazepam (ATIVAN)  0.5 MG tablet Take 0.5 mg by mouth at bedtime.   meclizine (ANTIVERT) 25 MG tablet Take 1 tablet (25 mg total) by mouth 3 (three) times daily as needed for dizziness.   nitroGLYCERIN (NITROSTAT) 0.4 MG SL tablet Place 0.4 mg under the tongue as needed for chest pain.   omeprazole (PRILOSEC) 40 MG capsule Take 30- 60 min before your first and last meals of the day    Review of Systems      All other systems reviewed and are otherwise negative except as noted above.  Physical Exam    VS:  BP Marland Kitchen)  150/52   Pulse (!) 54   Ht 5\' 5"  (1.651 m)   Wt 227 lb (103 kg)   BMI 37.77 kg/m  , BMI Body mass index is 37.77 kg/m.  Wt Readings from Last 3 Encounters:  12/15/22 227 lb (103 kg)  11/14/22 226 lb (102.5 kg)  10/03/22 226 lb (102.5 kg)     GEN: Well nourished, well developed, in no acute distress. HEENT: normal. Neck: Supple, no JVD, carotid bruits, or masses. Cardiac: RRR, no murmurs, rubs, or gallops. No clubbing, cyanosis, edema.  Radials/PT 2+ and equal bilaterally.  Respiratory:  Respirations regular and unlabored, clear to auscultation bilaterally. GI: Soft, nontender, nondistended. MS: No deformity or atrophy. Skin: Warm and dry, no rash. Neuro:  Strength and sensation are intact. Psych: Normal affect.  Assessment & Plan    Dizziness / Vertigo - Longstanding history of dizziness associated with spinning consistent with vertigo. Monitor 09/2022 with single 3.2 second pause that was not symptomatic, longstanding history of bifascicular block. Per previous discussion with Dr. Harl Bowie, continue to monitor . Echo 11/18/22 with normal LVEF and stable moderate AS unchanged from previous. Low suspicion cardiac etiology of her dizziness. As courtesy, will ask our valve team to review her echo images. Following with neurology who have ordered brain MRI for further evaluation. Continue Meclizine. Symptoms previously resolved with Allegra but she reports dyspnea. Discussed dyspnea would be  uncommon side effect of Allegra and she may retrial or choose instead to add Claritin.   CAD - Stable with no anginal symptoms. No indication for ischemic evaluation.  GDMT includes aspirin, atorvastatin.  No beta-blocker due to bifascicular block.    HLD, LDL goal <70 -LDL at goal.  Continue atorvastatin.  Aortic stenosis - echocardiogram 09/2021 moderate aortic stenosis mean gradient 24 mmHg.  Repeat echocardiogram for monitoring 11/18/2022 with normal LVEF 60 to 65%, severe MAC with trivial MR, severe calcification of aortic valve with moderate stenosis (mean gradient 24 mmHg).  Her aortic stenosis is unchanged from 1 year ago.  Low suspicion is contributory to her dizziness, detailed above.  She does report some exertional dyspnea and sensation of being volume overloaded for which we will have her take her Lasix daily for 2 days then once per week thereafter.  Recommend optimal BP control, as below.  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  Prior visit 10/03/22 vertigo like symptoms improved with Meclizine and resolved with addition of Allegra. No near syncope, syncope. Monitor with predominantly NSR one 3.2 second pause at 11am (during waking hours) which was not triggered. As symptoms resolve, will defer further workup at this time.  Previously discussed with Dr. Harl Bowie who agrees with plan to monitor.  HTN - BP well controlled by home monitoring. Mildly elevated in clinic. Continue current antihypertensive regimen of Amlodipine 5mg  QD.   BMI 37 - Weight loss via diet and exercise encouraged. Discussed the impact being overweight would have on cardiovascular risk.  Was previously planning to participate in prep exercise program at the [facility] but unfortunately they are no longer taking non [facility] patient. Encouraged to gradually increase physical activity at home.   Disposition: Follow up in 2 month(s) with Carlyle Dolly, MD or  APP.  Signed, Loel Dubonnet, NP 12/15/2022, 9:13 AM [place]

## 2022-12-16 ENCOUNTER — Telehealth (HOSPITAL_BASED_OUTPATIENT_CLINIC_OR_DEPARTMENT_OTHER): Payer: Self-pay

## 2022-12-16 NOTE — Telephone Encounter (Addendum)
Called patient at request of NP, the following recommendations were reviewed and patient verbalizes understanding and is agreeable with the plan to get scheduled!     ----- Message from Loel Dubonnet, NP sent at 12/16/2022  8:18 AM EDT ----- Thanks so much!  I'll have Mila Pair call her today just to let her know to be on the lookout for a call.  Daphene Jaeger - can you just call her to let her know the valve team was able to review her echocardiogram and they will be reaching out to schedule and appointment. They'll be discussing as her aortic valve stiffness as it is progressing toward severe to see what the best next steps for management are and how it may contribute to her shortness of breath.

## 2022-12-21 NOTE — Progress Notes (Unsigned)
Structural Heart Clinic Consult Note  No chief complaint on file.  History of Present Illness: 77 yo female with history of CAD, hyperlipidemia, HTN and aortic stenosis who is here today as a new consult in the structural heart clinic, referred by Dr. Harl Bowie, for further discussion regarding her aortic stenosis and possible TAVR. She had an MI in 1997 and further coronary stenting in 2000. She has been followed for moderate aortic stenosis. She has had dizziness over the past year which has improved some on Allegra and meclizine however her dizziness with standing persists. Echo 11/18/22 with LVEF=60-65%. Mild AI. Moderately severe to severe paradoxical low flow/low gradient aortic stenosis (mean gradient 24 mmHg, AVA 0.83 cm2, SVI 31, DI 0.27).   She tells me today that she *** She lives *** She is retired Psychologist, educational ***  Primary Care Physician: Corrington, Delsa Grana, MD Primary Cardiologist: *** Referring Cardiologist: ***  Past Medical History:  Diagnosis Date   Bilateral carotid bruits    Coronary artery disease with history of myocardial infarction without history of CABG    h/o MI in 1997, s/p PCI x3 stents in 1997, 2 stents in 2000   Essential hypertension    GAD (generalized anxiety disorder)    GERD (gastroesophageal reflux disease)    Mixed hyperlipidemia    Myocardial infarction (St[facility]) 1997   Nonrheumatic aortic (valve) stenosis    S/P coronary artery stent placement     Past Surgical History:  Procedure Laterality Date   ABDOMINAL HYSTERECTOMY  1988   LAPAROSCOPIC GASTROTOMY W/ REPAIR OF ULCER  1992   LAPAROSCOPIC OOPHERECTOMY     PERCUTANEOUS CORONARY STENT INTERVENTION (PCI-S)     h/o MI in 1997, s/p PCI x3 stents in 1997, 2 stents in 2000    Current Outpatient Medications  Medication Sig Dispense Refill   amLODipine (NORVASC) 5 MG tablet Take 1 tablet (5 mg total) by mouth daily. 90 tablet 3   aspirin EC 81 MG tablet Take 81 mg by mouth daily. Swallow whole.      atorvastatin (LIPITOR) 80 MG tablet Take 80 mg by mouth daily.     benzonatate (TESSALON) 100 MG capsule Take 100 mg by mouth 3 (three) times daily as needed.     clindamycin (CLINDAGEL) 1 % gel Apply topically 2 (two) times daily as needed.     Cyanocobalamin (VITAMIN B-12 IJ) Inject as directed every 30 (thirty) days.     ergocalciferol (VITAMIN D2) 1.25 MG (50000 UT) capsule Take 50,000 Units by mouth once a week.     famotidine (PEPCID) 40 MG tablet Take 1 tablet (40 mg total) by mouth at bedtime. 30 tablet 0   fexofenadine (ALLEGRA) 180 MG tablet Take 1 tablet (180 mg total) by mouth daily. 100 tablet 1   furosemide (LASIX) 20 MG tablet Take 20 mg by mouth daily as needed.     ibuprofen (ADVIL) 200 MG tablet Take 200 mg by mouth every 6 (six) hours as needed.     LORazepam (ATIVAN) 0.5 MG tablet Take 0.5 mg by mouth at bedtime.     meclizine (ANTIVERT) 25 MG tablet Take 1 tablet (25 mg total) by mouth 3 (three) times daily as needed for dizziness. 90 tablet 0   nitroGLYCERIN (NITROSTAT) 0.4 MG SL tablet Place 0.4 mg under the tongue as needed for chest pain.     omeprazole (PRILOSEC) 40 MG capsule Take 30- 60 min before your first and last meals of the day  No current facility-administered medications for this visit.    Allergies  Allergen Reactions   Erythromycin Other (See Comments)    Jaundice   Levocetirizine Shortness Of Breath    "choking, can't breathe"   Rosuvastatin Nausea And Vomiting    Other reaction(s): Abdominal Pain, Bleeding, Constipation, Cough, Dizziness   Fosamax [Alendronate] Other (See Comments)    Muscle spasm     Social History   Socioeconomic History   Marital status: Divorced    Spouse name: Not on file   Number of children: Not on file   Years of education: Not on file   Highest education level: Not on file  Occupational History   Not on file  Tobacco Use   Smoking status: Every Day    Packs/day: 1.00    Years: 55.00    Additional pack  years: 0.00    Total pack years: 55.00    Types: Cigarettes    Start date: 03/19/1963    Passive exposure: Never   Smokeless tobacco: Never   Tobacco comments:    smokes 5-10 cigarettes per day [facility] 12/31/21  Vaping Use   Vaping Use: Never used  Substance and Sexual Activity   Alcohol use: Not on file   Drug use: Not on file   Sexual activity: Not on file  Other Topics Concern   Not on file  Social History Narrative   Not on file   Social Determinants of Health   Financial Resource Strain: Not on file  Food Insecurity: Not on file  Transportation Needs: Not on file  Physical Activity: Not on file  Stress: Not on file  Social Connections: Not on file  Intimate Partner Violence: Not on file    Family History  Problem Relation Age of Onset   Depression Mother    Diabetes Mother    Heart failure Mother    Heart attack Mother 67   Breast cancer Mother    Heart attack Father 92   Heart attack Sister    Diabetes Brother    Depression Brother    Heart attack Brother 62   Heart attack Daughter     Review of Systems:  As stated in the HPI and otherwise negative.   There were no vitals taken for this visit.  Physical Examination: General: Well developed, well nourished, NAD  HEENT: OP clear, mucus membranes moist  SKIN: warm, dry. No rashes. Neuro: No focal deficits  Musculoskeletal: Muscle strength 5/5 all ext  Psychiatric: Mood and affect normal  Neck: No JVD, no carotid bruits, no thyromegaly, no lymphadenopathy.  Lungs:Clear bilaterally, no wheezes, rhonci, crackles Cardiovascular: Regular rate and rhythm. *** Loud, harsh, late peaking systolic murmur.  Abdomen:Soft. Bowel sounds present. Non-tender.  Extremities: *** No lower extremity edema. Pulses are 2 + in the bilateral DP/PT.  EKG:  EKG {ACTION; IS/IS [id number] ordered today. The ekg ordered today demonstrates ***  Echo 11/18/22:  1. Left ventricular ejection fraction, by estimation, is 60 to 65%. The   left ventricle has normal function. The left ventricle has no regional  wall motion abnormalities. There is mild concentric left ventricular  hypertrophy. Diastolic function  indeterminant due to moderate to severe MAC.   2. Right ventricular systolic function is normal. The right ventricular  size is normal. Mildly increased right ventricular wall thickness.   3. Left atrial size was mildly dilated.   4. The mitral valve is degenerative. Trivial mitral valve regurgitation.  No evidence of mitral stenosis. Moderate to severe mitral  annular  calcification.   5. The aortic valve is tricuspid. There is severe calcifcation of the  aortic valve. There is severe thickening of the aortic valve. Aortic valve  regurgitation is mild. Moderate aortic valve stenosis. Aortic valve area,  by VTI measures 0.89 cm. Aortic  valve mean gradient measures 24.0 mmHg. Aortic valve Vmax measures 3.37  m/s. DI 0.27.   6. There is borderline dilatation of the aortic root, measuring 38 mm.   7. The inferior vena cava is normal in size with greater than 50%  respiratory variability, suggesting right atrial pressure of 3 mmHg.   Comparison(s): No prior Echocardiogram.   FINDINGS   Left Ventricle: Left ventricular ejection fraction, by estimation, is 60  to 65%. The left ventricle has normal function. The left ventricle has no  regional wall motion abnormalities. 3D left ventricular ejection fraction  analysis performed but not  reported based on interpreter judgement due to suboptimal tracking. The  left ventricular internal cavity size was normal in size. There is mild  concentric left ventricular hypertrophy. Diastolic function indeterminant  due to moderate to severe MAC.   Right Ventricle: The right ventricular size is normal. Mildly increased  right ventricular wall thickness. Right ventricular systolic function is  normal.   Left Atrium: Left atrial size was mildly dilated.   Right Atrium: Right  atrial size was normal in size.   Pericardium: There is no evidence of pericardial effusion.   Mitral Valve: The mitral valve is degenerative in appearance. Moderate to  severe mitral annular calcification. Trivial mitral valve regurgitation.  No evidence of mitral valve stenosis.   Tricuspid Valve: The tricuspid valve is normal in structure. Tricuspid  valve regurgitation is trivial.   Aortic Valve: DI 0.27. The aortic valve is tricuspid. There is severe  calcifcation of the aortic valve. There is severe thickening of the aortic  valve. Aortic valve regurgitation is mild. Aortic regurgitation PHT  measures 503 msec. Moderate aortic  stenosis is present. Aortic valve mean gradient measures 24.0 mmHg. Aortic  valve peak gradient measures 45.4 mmHg. Aortic valve area, by VTI measures  0.89 cm.   Pulmonic Valve: The pulmonic valve was not well visualized. Pulmonic valve  regurgitation is not visualized.   Aorta: The aortic root is normal in size and structure. There is  borderline dilatation of the aortic root, measuring 38 mm.   Venous: The inferior vena cava is normal in size with greater than 50%  respiratory variability, suggesting right atrial pressure of 3 mmHg.   IAS/Shunts: The atrial septum is grossly normal.     LEFT VENTRICLE  PLAX 2D  LVIDd:         3.86 cm   Diastology  LVIDs:         2.63 cm   LV e' medial:    5.22 cm/s  LV PW:         1.38 cm   LV E/e' medial:  11.4  LV IVS:        1.19 cm   LV e' lateral:   6.20 cm/s  LVOT diam:     2.00 cm   LV E/e' lateral: 9.6  LV SV:         65  LV SV Index:   31  LVOT Area:     3.14 cm                             3D  Volume EF:                           3D EF:        51 %                           LV EDV:       153 ml                           LV ESV:       74 ml                           LV SV:        79 ml   RIGHT VENTRICLE  RV Basal diam:  3.89 cm  RV Mid diam:    2.42 cm  RV S prime:     14.80 cm/s  TAPSE  (M-mode): 3.3 cm   LEFT ATRIUM             Index        RIGHT ATRIUM           Index  LA diam:        2.50 cm 1.20 cm/m   RA Area:     17.40 cm  LA Vol (A2C):   80.3 ml 38.54 ml/m  RA Volume:   47.60 ml  22.85 ml/m  LA Vol (A4C):   84.0 ml 40.31 ml/m  LA Biplane Vol: 83.0 ml 39.83 ml/m   AORTIC VALVE  AV Area (Vmax):    0.84 cm  AV Area (Vmean):   0.83 cm  AV Area (VTI):     0.89 cm  AV Vmax:           337.00 cm/s  AV Vmean:          226.000 cm/s  AV VTI:            0.731 m  AV Peak Grad:      45.4 mmHg  AV Mean Grad:      24.0 mmHg  LVOT Vmax:         90.60 cm/s  LVOT Vmean:        59.500 cm/s  LVOT VTI:          0.206 m  LVOT/AV VTI ratio: 0.28  AI PHT:            503 msec    AORTA  Ao Root diam: 3.80 cm  Ao Asc diam:  3.60 cm   MITRAL VALVE  MV Area (PHT): 2.56 cm    SHUNTS  MV Decel Time: 296 msec    Systemic VTI:  0.21 m  MV E velocity: 59.50 cm/s  Systemic Diam: 2.00 cm  MV A velocity: 78.10 cm/s  MV E/A ratio:  0.76    Recent Labs: No results found for requested labs within last 365 days.   Lipid Panel No results found for: "CHOL", "TRIG", "HDL", "CHOLHDL", "VLDL", "LDLCALC", "LDLDIRECT"   Wt Readings from Last 3 Encounters:  12/15/22 103 kg  11/14/22 102.5 kg  10/03/22 102.5 kg    Assessment and Plan:   1. Severe Aortic Valve Stenosis: She has severe, stage D3 paradoxical low flow/low gradient aortic valve stenosis. I have personally reviewed the echo images. NYHA class ***. The aortic valve is thickened, calcified  with limited leaflet mobility. I think she would benefit from AVR. Given advanced age, she is not a good candidate for conventional AVR by surgical approach. I think she may be a good candidate for TAVR.    I have reviewed the natural history of aortic stenosis with the patient and their family members  who are present today. We have discussed the limitations of medical therapy and the poor prognosis associated with symptomatic aortic  stenosis. We have reviewed potential treatment options, including palliative medical therapy, conventional surgical aortic valve replacement, and transcatheter aortic valve replacement. We discussed treatment options in the context of the patient's specific comorbid medical conditions.   She would like to proceed with planning for TAVR. I will arrange a right and left heart catheterization at [facility] ***. Risks and benefits of the cath procedure and the valve procedure are reviewed with the patient. After the cath, she will have a cardiac CT, CTA of the chest/abdomen and pelvis and will then be referred to see one of the CT surgeons on our TAVR team.     Labs/ tests ordered today include:  No orders of the defined types were placed in this encounter.  Disposition:   F/U with the valve team  Signed, Lauree Chandler, MD, [facility] 12/21/2022 7:14 PM    [facility] [place], [place],   [address] Phone: [phone]; Fax: [phone]

## 2022-12-21 NOTE — H&P (View-Only) (Signed)
Structural Heart Clinic Consult Note  Chief Complaint  Patient presents with   New Patient (Initial Visit)    Aortic stenosis   History of Present Illness: 77 yo female with history of CAD, hyperlipidemia, HTN, ongoing tobacco abuse and aortic stenosis who is here today as a new consult in the structural heart clinic, referred by Dr. Harl Bowie, for further discussion regarding her aortic stenosis and possible TAVR. She had an MI in 1997 and had complex stenting of the LAD and Diagonal branch. She had a stent placed in the left PDA in 2001 and balloon angioplasty of the non-dominant RCA at that time. Last cardiac cath in 2007 with patency of the LAD and Circumflex stents. She has been followed for moderate aortic stenosis. She has had dizziness over the past year which has improved some on Allegra and meclizine however her dizziness with standing persists. Cardiac monitor in January 2024 with Sinus, PACs, PVCs, short runs of SVT, 3.2 second pause with 2:1 AV block. Echo 11/18/22 with LVEF=60-65%. Mild AI. Moderately severe to severe paradoxical low flow/low gradient aortic stenosis (mean gradient 24 mmHg, AVA 0.83 cm2, SVI 31, DI 0.27). Visually her valve appears to have severe stenosis.   She tells me today that she has progressive dyspnea on exertion. She has ongoing dizziness. This starts as the room spinning and then progresses to feeling lightheaded. No frank syncope but she has had near syncope. Rare pains under her left breast. No LE edema. She has been taking Lasix several days per week.   She lives with her son in [place], [place]. She is retired. She owned a Environmental consultant. She goes to the dentist regularly. She does not have dentures or any active dental issues.   Primary Care Physician: Curly Rim, MD Primary Cardiologist: Zandra Abts Referring Cardiologist: Zandra Abts  Past Medical History:  Diagnosis Date   Bilateral carotid bruits    Coronary artery disease with history of  myocardial infarction without history of CABG    h/o MI in 1997, s/p PCI x3 stents in 1997, 2 stents in 2000   Essential hypertension    GAD (generalized anxiety disorder)    GERD (gastroesophageal reflux disease)    Mixed hyperlipidemia    Myocardial infarction ([place]) 1997   Nonrheumatic aortic (valve) stenosis    S/P coronary artery stent placement     Past Surgical History:  Procedure Laterality Date   ABDOMINAL HYSTERECTOMY  1988   LAPAROSCOPIC GASTROTOMY W/ REPAIR OF ULCER  1992   LAPAROSCOPIC OOPHERECTOMY     PERCUTANEOUS CORONARY STENT INTERVENTION (PCI-S)     h/o MI in 1997, s/p PCI x3 stents in 1997, 2 stents in 2000    Current Outpatient Medications  Medication Sig Dispense Refill   amLODipine (NORVASC) 5 MG tablet Take 1 tablet (5 mg total) by mouth daily. 90 tablet 3   aspirin EC 81 MG tablet Take 81 mg by mouth daily. Swallow whole.     atorvastatin (LIPITOR) 80 MG tablet Take 80 mg by mouth daily.     benzonatate (TESSALON) 100 MG capsule Take 100 mg by mouth 3 (three) times daily as needed.     clindamycin (CLINDAGEL) 1 % gel Apply topically 2 (two) times daily as needed.     Cyanocobalamin (VITAMIN B-12 IJ) Inject as directed every 30 (thirty) days.     ergocalciferol (VITAMIN D2) 1.25 MG (50000 UT) capsule Take 50,000 Units by mouth once a week.     famotidine (PEPCID) 40  MG tablet Take 1 tablet (40 mg total) by mouth at bedtime. 30 tablet 0   fexofenadine (ALLEGRA) 180 MG tablet Take 1 tablet (180 mg total) by mouth daily. 100 tablet 1   furosemide (LASIX) 20 MG tablet Take 20 mg by mouth daily as needed.     ibuprofen (ADVIL) 200 MG tablet Take 200 mg by mouth every 6 (six) hours as needed.     LORazepam (ATIVAN) 0.5 MG tablet Take 0.5 mg by mouth at bedtime.     meclizine (ANTIVERT) 25 MG tablet Take 1 tablet (25 mg total) by mouth 3 (three) times daily as needed for dizziness. 90 tablet 0   nitroGLYCERIN (NITROSTAT) 0.4 MG SL tablet Place 0.4 mg under the tongue  as needed for chest pain.     omeprazole (PRILOSEC) 40 MG capsule Take 30- 60 min before your first and last meals of the day     No current facility-administered medications for this visit.    Allergies  Allergen Reactions   Erythromycin Other (See Comments)    Jaundice   Levocetirizine Shortness Of Breath    "choking, can't breathe"   Rosuvastatin Nausea And Vomiting    Other reaction(s): Abdominal Pain, Bleeding, Constipation, Cough, Dizziness   Fosamax [Alendronate] Other (See Comments)    Muscle spasm     Social History   Socioeconomic History   Marital status: Divorced    Spouse name: Not on file   Number of children: 1   Years of education: Not on file   Highest education level: Not on file  Occupational History   Occupation: Retired-Ran a Environmental consultant  Tobacco Use   Smoking status: Every Day    Packs/day: 1.00    Years: 55.00    Additional pack years: 0.00    Total pack years: 55.00    Types: Cigarettes    Start date: 03/19/1963    Passive exposure: Never   Smokeless tobacco: Never   Tobacco comments:    smokes 5-10 cigarettes per day [facility] 12/31/21  Vaping Use   Vaping Use: Never used  Substance and Sexual Activity   Alcohol use: Not on file   Drug use: Not on file   Sexual activity: Not on file  Other Topics Concern   Not on file  Social History Narrative   Not on file   Social Determinants of Health   Financial Resource Strain: Not on file  Food Insecurity: Not on file  Transportation Needs: Not on file  Physical Activity: Not on file  Stress: Not on file  Social Connections: Not on file  Intimate Partner Violence: Not on file    Family History  Problem Relation Age of Onset   Depression Mother    Diabetes Mother    Heart failure Mother    Heart attack Mother 81   Breast cancer Mother    Heart attack Father 51   Heart attack Sister    Diabetes Brother    Depression Brother    Heart attack Brother 72   Heart attack Daughter      Review of Systems:  As stated in the HPI and otherwise negative.   BP (!) 130/90   Pulse 64   Ht 5\' 4"  (1.626 m)   Wt 102 kg   SpO2 91%   BMI 38.59 kg/m   Physical Examination: General: Well developed, well nourished, NAD  HEENT: OP clear, mucus membranes moist  SKIN: warm, dry. No rashes. Neuro: No focal deficits  Musculoskeletal: Muscle  strength 5/5 all ext  Psychiatric: Mood and affect normal  Neck: No JVD, no carotid bruits, no thyromegaly, no lymphadenopathy.  Lungs:Clear bilaterally, no wheezes, rhonci, crackles Cardiovascular: Regular rate and rhythm. Loud, harsh, late peaking systolic murmur.  Abdomen:Soft. Bowel sounds present. Non-tender.  Extremities: No lower extremity edema. Pulses are 2 + in the bilateral DP/PT.  EKG:  EKG is ordered today. The ekg ordered today demonstrates Sinus. RBBB. Mobitz 1 AV block. LAFB.   Echo 11/18/22:  1. Left ventricular ejection fraction, by estimation, is 60 to 65%. The  left ventricle has normal function. The left ventricle has no regional  wall motion abnormalities. There is mild concentric left ventricular  hypertrophy. Diastolic function  indeterminant due to moderate to severe MAC.   2. Right ventricular systolic function is normal. The right ventricular  size is normal. Mildly increased right ventricular wall thickness.   3. Left atrial size was mildly dilated.   4. The mitral valve is degenerative. Trivial mitral valve regurgitation.  No evidence of mitral stenosis. Moderate to severe mitral annular  calcification.   5. The aortic valve is tricuspid. There is severe calcifcation of the  aortic valve. There is severe thickening of the aortic valve. Aortic valve  regurgitation is mild. Moderate aortic valve stenosis. Aortic valve area,  by VTI measures 0.89 cm. Aortic  valve mean gradient measures 24.0 mmHg. Aortic valve Vmax measures 3.37  m/s. DI 0.27.   6. There is borderline dilatation of the aortic root,  measuring 38 mm.   7. The inferior vena cava is normal in size with greater than 50%  respiratory variability, suggesting right atrial pressure of 3 mmHg.   Comparison(s): No prior Echocardiogram.   FINDINGS   Left Ventricle: Left ventricular ejection fraction, by estimation, is 60  to 65%. The left ventricle has normal function. The left ventricle has no  regional wall motion abnormalities. 3D left ventricular ejection fraction  analysis performed but not  reported based on interpreter judgement due to suboptimal tracking. The  left ventricular internal cavity size was normal in size. There is mild  concentric left ventricular hypertrophy. Diastolic function indeterminant  due to moderate to severe MAC.   Right Ventricle: The right ventricular size is normal. Mildly increased  right ventricular wall thickness. Right ventricular systolic function is  normal.   Left Atrium: Left atrial size was mildly dilated.   Right Atrium: Right atrial size was normal in size.   Pericardium: There is no evidence of pericardial effusion.   Mitral Valve: The mitral valve is degenerative in appearance. Moderate to  severe mitral annular calcification. Trivial mitral valve regurgitation.  No evidence of mitral valve stenosis.   Tricuspid Valve: The tricuspid valve is normal in structure. Tricuspid  valve regurgitation is trivial.   Aortic Valve: DI 0.27. The aortic valve is tricuspid. There is severe  calcifcation of the aortic valve. There is severe thickening of the aortic  valve. Aortic valve regurgitation is mild. Aortic regurgitation PHT  measures 503 msec. Moderate aortic  stenosis is present. Aortic valve mean gradient measures 24.0 mmHg. Aortic  valve peak gradient measures 45.4 mmHg. Aortic valve area, by VTI measures  0.89 cm.   Pulmonic Valve: The pulmonic valve was not well visualized. Pulmonic valve  regurgitation is not visualized.   Aorta: The aortic root is normal in size  and structure. There is  borderline dilatation of the aortic root, measuring 38 mm.   Venous: The inferior vena cava is normal in  size with greater than 50%  respiratory variability, suggesting right atrial pressure of 3 mmHg.   IAS/Shunts: The atrial septum is grossly normal.     LEFT VENTRICLE  PLAX 2D  LVIDd:         3.86 cm   Diastology  LVIDs:         2.63 cm   LV e' medial:    5.22 cm/s  LV PW:         1.38 cm   LV E/e' medial:  11.4  LV IVS:        1.19 cm   LV e' lateral:   6.20 cm/s  LVOT diam:     2.00 cm   LV E/e' lateral: 9.6  LV SV:         65  LV SV Index:   31  LVOT Area:     3.14 cm                             3D Volume EF:                           3D EF:        51 %                           LV EDV:       153 ml                           LV ESV:       74 ml                           LV SV:        79 ml   RIGHT VENTRICLE  RV Basal diam:  3.89 cm  RV Mid diam:    2.42 cm  RV S prime:     14.80 cm/s  TAPSE (M-mode): 3.3 cm   LEFT ATRIUM             Index        RIGHT ATRIUM           Index  LA diam:        2.50 cm 1.20 cm/m   RA Area:     17.40 cm  LA Vol (A2C):   80.3 ml 38.54 ml/m  RA Volume:   47.60 ml  22.85 ml/m  LA Vol (A4C):   84.0 ml 40.31 ml/m  LA Biplane Vol: 83.0 ml 39.83 ml/m   AORTIC VALVE  AV Area (Vmax):    0.84 cm  AV Area (Vmean):   0.83 cm  AV Area (VTI):     0.89 cm  AV Vmax:           337.00 cm/s  AV Vmean:          226.000 cm/s  AV VTI:            0.731 m  AV Peak Grad:      45.4 mmHg  AV Mean Grad:      24.0 mmHg  LVOT Vmax:         90.60 cm/s  LVOT Vmean:        59.500 cm/s  LVOT VTI:  0.206 m  LVOT/AV VTI ratio: 0.28  AI PHT:            503 msec    AORTA  Ao Root diam: 3.80 cm  Ao Asc diam:  3.60 cm   MITRAL VALVE  MV Area (PHT): 2.56 cm    SHUNTS  MV Decel Time: 296 msec    Systemic VTI:  0.21 m  MV E velocity: 59.50 cm/s  Systemic Diam: 2.00 cm  MV A velocity: 78.10 cm/s  MV E/A ratio:  0.76     Recent Labs: No results found for requested labs within last 365 days.   Lipid Panel No results found for: "CHOL", "TRIG", "HDL", "CHOLHDL", "VLDL", "LDLCALC", "LDLDIRECT"   Wt Readings from Last 3 Encounters:  12/22/22 102 kg  12/15/22 103 kg  11/14/22 102.5 kg    Assessment and Plan:   1. Severe Aortic Valve Stenosis: She has severe, stage D3 paradoxical low flow/low gradient aortic valve stenosis.NYHA class 3 symptoms.  I have personally reviewed the echo images. The aortic valve is thickened, calcified with limited leaflet mobility. I think she would benefit from AVR. Given advanced age, she is not a good candidate for conventional AVR by surgical approach. I think she may be a good candidate for TAVR. High risk features for high grade heart block following TAVR (baseline RBBB, LAFB and Mobitz 1 AV block).   I have reviewed the natural history of aortic stenosis with the patient and their family members  who are present today. We have discussed the limitations of medical therapy and the poor prognosis associated with symptomatic aortic stenosis. We have reviewed potential treatment options, including palliative medical therapy, conventional surgical aortic valve replacement, and transcatheter aortic valve replacement. We discussed treatment options in the context of the patient's specific comorbid medical conditions.   She would like to proceed with planning for TAVR. I will arrange a right and left heart catheterization at [facility] 12/26/22 at noon. Risks and benefits of the cath procedure and the valve procedure are reviewed with the patient. After the cath, she will have a cardiac CT, CTA of the chest/abdomen and pelvis and will then be referred to see one of the CT surgeons on our TAVR team.     Labs/ tests ordered today include:   Orders Placed This Encounter  Procedures   Basic metabolic panel   CBC   EKG 12-Lead   Disposition:   F/U with the valve team  Signed, Lauree Chandler, MD, [facility] 12/22/2022 3:06 PM    [address], [place], [place]  [address] Phone: [phone]; Fax: [phone]

## 2022-12-22 ENCOUNTER — Ambulatory Visit: Payer: 59 | Attending: Cardiovascular Disease | Admitting: Cardiovascular Disease

## 2022-12-22 ENCOUNTER — Encounter: Payer: Self-pay | Admitting: Cardiovascular Disease

## 2022-12-22 VITALS — BP 130/90 | HR 64 | Ht 64.0 in | Wt 224.8 lb

## 2022-12-22 DIAGNOSIS — Z01812 Encounter for preprocedural laboratory examination: Secondary | ICD-10-CM | POA: Diagnosis not present

## 2022-12-22 DIAGNOSIS — I452 Bifascicular block: Secondary | ICD-10-CM

## 2022-12-22 DIAGNOSIS — I350 Nonrheumatic aortic (valve) stenosis: Secondary | ICD-10-CM

## 2022-12-22 LAB — BASIC METABOLIC PANEL
BUN/Creatinine Ratio: 19 (ref 12–28)
BUN: 23 mg/dL (ref 8–27)
CO2: 24 mmol/L (ref 20–29)
Calcium: 10 mg/dL (ref 8.7–10.3)
Chloride: 101 mmol/L (ref 96–106)
Creatinine, Ser: 1.22 mg/dL — ABNORMAL HIGH (ref 0.57–1.00)
Glucose: 94 mg/dL (ref 70–99)
Potassium: 4.9 mmol/L (ref 3.5–5.2)
Sodium: 136 mmol/L (ref 134–144)
eGFR: 46 mL/min/{1.73_m2} — ABNORMAL LOW (ref >59)

## 2022-12-22 LAB — CBC
Hematocrit: 36.4 % (ref 34.0–46.6)
Hemoglobin: 12.2 g/dL (ref 11.1–15.9)
MCH: 28.6 pg (ref 26.6–33.0)
MCHC: 33.5 g/dL (ref 31.5–35.7)
MCV: 85 fL (ref 79–97)
Platelets: 264 10*3/uL (ref 150–450)
RBC: 4.27 x10E6/uL (ref 3.77–5.28)
RDW: 17.4 % — ABNORMAL HIGH (ref 11.7–15.4)
WBC: 7.5 10*3/uL (ref 3.4–10.8)

## 2022-12-22 NOTE — Patient Instructions (Addendum)
Medication Instructions:  No changes *If you need a refill on your cardiac medications before your next appointment, please call your pharmacy*   Lab Work: Today: cbc, bmet   Testing/Procedures: Your physician has requested that you have a cardiac catheterization. Cardiac catheterization is used to diagnose and/or treat various heart conditions. Doctors may recommend this procedure for a number of different reasons. The most common reason is to evaluate chest pain. Chest pain can be a symptom of coronary artery disease (CAD), and cardiac catheterization can show whether plaque is narrowing or blocking your heart's arteries. This procedure is also used to evaluate the valves, as well as measure the blood flow and oxygen levels in different parts of your heart. For further information please visit [email or web address]. Please follow instruction sheet, as given.   Follow-Up: Per Structural Hear Team         Cardiac/Peripheral Catheterization   You are scheduled for a Cardiac Catheterization on Friday, March 29 with Dr. Lauree Chandler.  1. Please arrive at the Main Entrance A at [facility]: [place], [place] [address] on March 29 at 10:00 AM (This time is two hours before your procedure to ensure your preparation). Free valet parking service is available. You will check in at ADMITTING. The support person will be asked to wait in the waiting room.  It is OK to have someone drop you off and come back when you are ready to be discharged.        Special note: Every effort is made to have your procedure done on time. Please understand that emergencies sometimes delay scheduled procedures.   . 2. Diet: Do not eat solid foods after midnight.  You may have clear liquids until 5 AM the day of the procedure.  3. Labs: today.  You do not need to be fasting.  4. Medication instructions in preparation for your procedure:   Contrast Allergy: No   On the morning of  your procedure, take Aspirin 81 mg and any morning medicines NOT listed above.  You may use sips of water.  5. Plan to go home the same day, you will only stay overnight if medically necessary. 6. You MUST have a responsible adult to drive you home. 7. An adult MUST be with you the first 24 hours after you arrive home. 8. Bring a current list of your medications, and the last time and date medication taken. 9. Bring ID and current insurance cards. 10.Please wear clothes that are easy to get on and off and wear slip-on shoes.  Thank you for allowing [place] to care for you!   -- [facility]

## 2022-12-22 NOTE — Progress Notes (Addendum)
Pre Surgical Assessment: 5 M Walk Test  23M=16.68ft  5 Meter Walk Test- trial 1: 18.21 seconds 5 Meter Walk Test- trial 2: 14.86 seconds 5 Meter Walk Test- trial 3: 9.27 seconds 5 Meter Walk Test Average: 14.11 seconds     STS Risk calculation  Procedure Type: Isolated AVR PERIOPERATIVE OUTCOME ESTIMATE % Operative Mortality 2.11% Morbidity & Mortality 7.4% Stroke 0.846% Renal Failure 1.52% Reoperation 2.31% Prolonged Ventilation 4.31% Deep Sternal Wound Infection 0.121% Long Hospital Stay (>14 days) 3.68% Short Hospital Stay (<6 days)* 45.1%

## 2022-12-24 ENCOUNTER — Telehealth: Payer: Self-pay | Admitting: *Deleted

## 2022-12-24 NOTE — Telephone Encounter (Signed)
Cardiac Catheterization scheduled at [facility] for: Friday December 26, 2022 12 noon Arrival time [place] Entrance A at: 10 AM  Nothing to eat after midnight prior to procedure, clear liquids until 5 AM day of procedure.  Medication instructions: -Hold:  Lasix/Ibuprofen-day before and day of procedure-per protocol GFR 46  Patient reports she takes lasix prn. -Other usual morning medications can be taken with sips of water including aspirin 81 mg.  Confirmed patient has responsible adult to drive home post procedure and be with patient first 24 hours after arriving home.  Plan to go home the same day, you will only stay overnight if medically necessary.  Reviewed procedure instructions with patient.

## 2022-12-26 ENCOUNTER — Ambulatory Visit (HOSPITAL_COMMUNITY)
Admission: RE | Admit: 2022-12-26 | Discharge: 2022-12-26 | Disposition: A | Discharge status: Home / Self Care | Payer: 59 | Attending: Cardiovascular Disease | Admitting: Cardiovascular Disease

## 2022-12-26 ENCOUNTER — Encounter (HOSPITAL_COMMUNITY)
Admission: RE | Disposition: A | Discharge status: Home / Self Care | Payer: Self-pay | Source: Home / Self Care | Attending: Cardiovascular Disease

## 2022-12-26 ENCOUNTER — Other Ambulatory Visit: Payer: Self-pay

## 2022-12-26 ENCOUNTER — Ambulatory Visit: Payer: 59 | Admitting: Student

## 2022-12-26 DIAGNOSIS — I2510 Atherosclerotic heart disease of native coronary artery without angina pectoris: Secondary | ICD-10-CM

## 2022-12-26 DIAGNOSIS — Z955 Presence of coronary angioplasty implant and graft: Secondary | ICD-10-CM | POA: Insufficient documentation

## 2022-12-26 DIAGNOSIS — I350 Nonrheumatic aortic (valve) stenosis: Secondary | ICD-10-CM

## 2022-12-26 DIAGNOSIS — F17210 Nicotine dependence, cigarettes, uncomplicated: Secondary | ICD-10-CM | POA: Diagnosis not present

## 2022-12-26 DIAGNOSIS — I2582 Chronic total occlusion of coronary artery: Secondary | ICD-10-CM | POA: Insufficient documentation

## 2022-12-26 DIAGNOSIS — I10 Essential (primary) hypertension: Secondary | ICD-10-CM | POA: Insufficient documentation

## 2022-12-26 DIAGNOSIS — I252 Old myocardial infarction: Secondary | ICD-10-CM | POA: Diagnosis not present

## 2022-12-26 HISTORY — PX: RIGHT/LEFT HEART CATH AND CORONARY ANGIOGRAPHY: CATH118266

## 2022-12-26 LAB — POCT I-STAT EG7
Acid-base deficit: 3 mmol/L — ABNORMAL HIGH (ref 0.0–2.0)
Bicarbonate: 22.1 mmol/L (ref 20.0–28.0)
Calcium, Ion: 1.12 mmol/L — ABNORMAL LOW (ref 1.15–1.40)
HCT: 32 % — ABNORMAL LOW (ref 36.0–46.0)
Hemoglobin: 10.9 g/dL — ABNORMAL LOW (ref 12.0–15.0)
O2 Saturation: 77 %
Potassium: 3.9 mmol/L (ref 3.5–5.1)
Sodium: 140 mmol/L (ref 135–145)
TCO2: 23 mmol/L (ref 22–32)
pCO2, Ven: 40.9 mmHg — ABNORMAL LOW (ref 44–60)
pH, Ven: 7.341 (ref 7.25–7.43)
pO2, Ven: 44 mmHg (ref 32–45)

## 2022-12-26 LAB — POCT I-STAT 7, (LYTES, BLD GAS, ICA,H+H)
Acid-base deficit: 4 mmol/L — ABNORMAL HIGH (ref 0.0–2.0)
Bicarbonate: 21.6 mmol/L (ref 20.0–28.0)
Calcium, Ion: 1.12 mmol/L — ABNORMAL LOW (ref 1.15–1.40)
HCT: 31 % — ABNORMAL LOW (ref 36.0–46.0)
Hemoglobin: 10.5 g/dL — ABNORMAL LOW (ref 12.0–15.0)
O2 Saturation: 98 %
Potassium: 4 mmol/L (ref 3.5–5.1)
Sodium: 140 mmol/L (ref 135–145)
TCO2: 23 mmol/L (ref 22–32)
pCO2 arterial: 38.9 mmHg (ref 32–48)
pH, Arterial: 7.352 (ref 7.35–7.45)
pO2, Arterial: 102 mmHg (ref 83–108)

## 2022-12-26 SURGERY — RIGHT/LEFT HEART CATH AND CORONARY ANGIOGRAPHY
Anesthesia: LOCAL

## 2022-12-26 MED ORDER — VERAPAMIL HCL 2.5 MG/ML IV SOLN
INTRAVENOUS | Status: AC
  Filled 2022-12-26: qty 2

## 2022-12-26 MED ORDER — HEPARIN (PORCINE) IN NACL 1000-0.9 UT/500ML-% IV SOLN
INTRAVENOUS | Status: DC | PRN
  Administered 2022-12-26 (×2): 500 mL

## 2022-12-26 MED ORDER — ONDANSETRON HCL 4 MG/2ML IJ SOLN: 4.0000 mg | Freq: Four times a day (QID) | INTRAMUSCULAR | Status: DC | PRN

## 2022-12-26 MED ORDER — ASPIRIN 81 MG PO CHEW: 81.0000 mg | CHEWABLE_TABLET | ORAL | Status: DC

## 2022-12-26 MED ORDER — HEPARIN SODIUM (PORCINE) 1000 UNIT/ML IJ SOLN
INTRAMUSCULAR | Status: DC | PRN
  Administered 2022-12-26: 5000 [IU] via INTRAVENOUS

## 2022-12-26 MED ORDER — ACETAMINOPHEN 325 MG PO TABS: 650.0000 mg | ORAL_TABLET | ORAL | Status: DC | PRN

## 2022-12-26 MED ORDER — MIDAZOLAM HCL 2 MG/2ML IJ SOLN
INTRAMUSCULAR | Status: DC | PRN
  Administered 2022-12-26: 1 mg via INTRAVENOUS

## 2022-12-26 MED ORDER — FENTANYL CITRATE (PF) 100 MCG/2ML IJ SOLN
INTRAMUSCULAR | Status: DC | PRN
  Administered 2022-12-26: 25 ug via INTRAVENOUS

## 2022-12-26 MED ORDER — LABETALOL HCL 5 MG/ML IV SOLN: 10.0000 mg | INTRAVENOUS | Status: DC | PRN

## 2022-12-26 MED ORDER — HYDRALAZINE HCL 20 MG/ML IJ SOLN: 10.0000 mg | INTRAMUSCULAR | Status: DC | PRN

## 2022-12-26 MED ORDER — LIDOCAINE HCL (PF) 1 % IJ SOLN
INTRAMUSCULAR | Status: DC | PRN
  Administered 2022-12-26 (×3): 2 mL

## 2022-12-26 MED ORDER — SODIUM CHLORIDE 0.9% FLUSH: 3.0000 mL | Freq: Two times a day (BID) | INTRAVENOUS | Status: DC

## 2022-12-26 MED ORDER — SODIUM CHLORIDE 0.9% FLUSH: 3.0000 mL | INTRAVENOUS | Status: DC | PRN

## 2022-12-26 MED ORDER — MIDAZOLAM HCL 2 MG/2ML IJ SOLN
INTRAMUSCULAR | Status: AC
  Filled 2022-12-26: qty 2

## 2022-12-26 MED ORDER — VERAPAMIL HCL 2.5 MG/ML IV SOLN
INTRAVENOUS | Status: DC | PRN
  Administered 2022-12-26: 10 mL via INTRA_ARTERIAL

## 2022-12-26 MED ORDER — SODIUM CHLORIDE 0.9 % IV SOLN: 250.0000 mL | INTRAVENOUS | Status: DC | PRN

## 2022-12-26 MED ORDER — IOHEXOL 350 MG/ML SOLN
INTRAVENOUS | Status: DC | PRN
  Administered 2022-12-26: 90 mL

## 2022-12-26 MED ORDER — HEPARIN SODIUM (PORCINE) 1000 UNIT/ML IJ SOLN
INTRAMUSCULAR | Status: AC
  Filled 2022-12-26: qty 10

## 2022-12-26 MED ORDER — LIDOCAINE HCL (PF) 1 % IJ SOLN
INTRAMUSCULAR | Status: AC
  Filled 2022-12-26: qty 30

## 2022-12-26 MED ORDER — FENTANYL CITRATE (PF) 100 MCG/2ML IJ SOLN
INTRAMUSCULAR | Status: AC
  Filled 2022-12-26: qty 2

## 2022-12-26 MED ORDER — SODIUM CHLORIDE 0.9 % WEIGHT BASED INFUSION
3.0000 mL/kg/h | INTRAVENOUS | Status: AC
  Administered 2022-12-26: 3 mL/kg/h via INTRAVENOUS

## 2022-12-26 MED ORDER — SODIUM CHLORIDE 0.9 % IV SOLN: INTRAVENOUS | Status: DC

## 2022-12-26 MED ORDER — SODIUM CHLORIDE 0.9 % WEIGHT BASED INFUSION: 1.0000 mL/kg/h | INTRAVENOUS | Status: DC

## 2022-12-26 SURGICAL SUPPLY — 14 items
CATH 5FR JL3.5 JR4 ANG PIG MP (CATHETERS) IMPLANT
CATH BALLN WEDGE 5F 110CM (CATHETERS) IMPLANT
CATH INFINITI 5FR AL1 (CATHETERS) IMPLANT
DEVICE RAD COMP TR BAND LRG (VASCULAR PRODUCTS) IMPLANT
GLIDESHEATH SLEND SS 6F .021 (SHEATH) IMPLANT
GUIDEWIRE INQWIRE 1.5J.035X260 (WIRE) IMPLANT
INQWIRE 1.5J .035X260CM (WIRE) ×1
KIT HEART LEFT (KITS) ×1 IMPLANT
PACK CARDIAC CATHETERIZATION (CUSTOM PROCEDURE TRAY) ×1 IMPLANT
SHEATH GLIDE SLENDER 4/5FR (SHEATH) IMPLANT
SHEATH PROBE COVER 6X72 (BAG) IMPLANT
TRANSDUCER W/STOPCOCK (MISCELLANEOUS) ×1 IMPLANT
TUBING CIL FLEX 10 FLL-RA (TUBING) ×1 IMPLANT
WIRE HI TORQ VERSACORE-J 145CM (WIRE) IMPLANT

## 2022-12-26 NOTE — Progress Notes (Signed)
TR BAND REMOVAL  LOCATION:    right radial  DEFLATED PER PROTOCOL:    Yes.    TIME BAND OFF / DRESSING APPLIED: 12/26/22 at [place] ARRIVAL:    Level 0  SITE AFTER BAND REMOVAL:    Level 0  CIRCULATION SENSATION AND MOVEMENT:    Within Normal Limits   Yes.    COMMENTS:

## 2022-12-26 NOTE — Interval H&P Note (Signed)
History and Physical Interval Note:  12/26/2022 11:41 AM  Jenna Allen  has presented today for surgery, with the diagnosis of aortic stenosis.  The various methods of treatment have been discussed with the patient and family. After consideration of risks, benefits and other options for treatment, the patient has consented to  Procedure(s): RIGHT/LEFT HEART CATH AND CORONARY ANGIOGRAPHY (N/A) as a surgical intervention.  The patient's history has been reviewed, patient examined, no change in status, stable for surgery.  I have reviewed the patient's chart and labs.  Questions were answered to the patient's satisfaction.    Cath Lab Visit (complete for each Cath Lab visit)  Clinical Evaluation Leading to the Procedure:   ACS: No.  Non-ACS:    Anginal Classification: CCS II  Anti-ischemic medical therapy: Minimal Therapy (1 class of medications)  Non-Invasive Test Results: No non-invasive testing performed  Prior CABG: No previous CABG       Lauree Chandler

## 2022-12-29 ENCOUNTER — Encounter (HOSPITAL_COMMUNITY): Payer: Self-pay | Admitting: Cardiovascular Disease

## 2023-01-02 ENCOUNTER — Ambulatory Visit (HOSPITAL_COMMUNITY)
Admission: RE | Admit: 2023-01-02 | Discharge: 2023-01-02 | Disposition: A | Discharge status: Home / Self Care | Payer: 59 | Source: Ambulatory Visit | Attending: Cardiovascular Disease | Admitting: Cardiovascular Disease

## 2023-01-02 DIAGNOSIS — I350 Nonrheumatic aortic (valve) stenosis: Secondary | ICD-10-CM

## 2023-01-02 MED ORDER — IOHEXOL 350 MG/ML SOLN
100.0000 mL | Freq: Once | INTRAVENOUS | Status: AC | PRN
  Administered 2023-01-02: 100 mL via INTRAVENOUS

## 2023-01-02 MED ORDER — NITROGLYCERIN 0.4 MG SL SUBL: 0.8000 mg | SUBLINGUAL_TABLET | Freq: Once | SUBLINGUAL | Status: DC

## 2023-01-08 ENCOUNTER — Encounter: Payer: 59 | Admitting: Surgery

## 2023-01-12 ENCOUNTER — Encounter: Payer: Self-pay | Admitting: Cardiovascular Disease

## 2023-01-12 ENCOUNTER — Ambulatory Visit: Payer: 59 | Attending: Cardiovascular Disease | Admitting: Cardiovascular Disease

## 2023-01-12 VITALS — BP 124/70 | HR 79 | Ht 65.0 in | Wt 226.4 lb

## 2023-01-12 DIAGNOSIS — I350 Nonrheumatic aortic (valve) stenosis: Secondary | ICD-10-CM

## 2023-01-12 NOTE — Patient Instructions (Signed)
Medication Instructions:  No changes today. *If you need a refill on your cardiac medications before your next appointment, please call your pharmacy*   Lab Work: none If you have labs (blood work) drawn today and your tests are completely normal, you will receive your results only by: MyChart Message (if you have MyChart) OR A paper copy in the mail If you have any lab test that is abnormal or we need to change your treatment, we will call you to review the results.   Testing/Procedures:ECHO DUE LATE AUGUST Your physician has requested that you have an echocardiogram. Echocardiography is a painless test that uses sound waves to create images of your heart. It provides your doctor with information about the size and shape of your heart and how well your heart's chambers and valves are working. This procedure takes approximately one hour. There are no restrictions for this procedure. Please do NOT wear cologne, perfume, aftershave, or lotions (deodorant is allowed). Please arrive 15 minutes prior to your appointment time.    Follow-Up: AFTER ECHO IN AUGUST/SEPTEMBER

## 2023-01-12 NOTE — Progress Notes (Signed)
Structural Heart Clinic Note  Chief Complaint  Patient presents with   Follow-up    Aortic stenosis   History of Present Illness: 77 yo female with history of CAD, hyperlipidemia, HTN, ongoing tobacco abuse and aortic stenosis who is here today for follow up. I saw her as a new consult in the structural heart clinic 12/23/22 for further discussion regarding her aortic stenosis and possible TAVR. She had an MI in 1997 and had complex stenting of the LAD and Diagonal branch. She had a stent placed in the left PDA in 2001 and balloon angioplasty of the non-dominant RCA at that time. Cardiac cath in 2007 with patency of the LAD and Circumflex stents. She has been followed for moderate aortic stenosis. She has had dizziness over the past year which has improved some on Allegra and meclizine however her dizziness with standing persists. Cardiac monitor in January 2024 with Sinus, PACs, PVCs, short runs of SVT, 3.2 second pause with 2:1 AV block. Echo 11/18/22 with LVEF=60-65%. Mild AI. Moderately severe to severe paradoxical low flow/low gradient aortic stenosis (mean gradient 24 mmHg, AVA 0.83 cm2, SVI 31, DI 0.27). Visually her valve appears to have severe stenosis. We proceeded with planning for TAVR. Cardiac cath 12/26/22 with patent LAD/Diagonal stents with moderate restenosis, patent distal Circumflex stented segment with severe restenosis-not favorable for PCI. The non-dominant RCA is chronically occluded proximally and fills from left to right collaterals. Cardiac CTA with AV calcium score of 829. Annular area of 363 mm2. Low left main height at 5.5 mm. Her aortic stenosis is felt to be moderate based on the valve appearance on her the CT with AVA of 1.44 cm2. The annular area is suitable for a 23 mm Edwards valve or 26 mm Medtronic valve. She appears to have femoral access for TAVR. Her case was reviewed at the structural team meeting and her AS is felt to be moderate at this time.   She tells me today  that she is feeling better. No dyspnea or chest pain. Still with some dizziness but improved. No LE edema.   CTA with right adrenal mass. I reviewed this with her and she states this was present last year. 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   [place] She is retired. She owned a Science writer. She goes to the dentist regularly. She does not have dentures or any active dental issues.   Primary Care Physician: Vivien Presto, MD Primary Cardiologist: Dominga Ferry Referring Cardiologist: Dominga Ferry  Past Medical History:  Diagnosis Date   Bilateral carotid bruits    Coronary artery disease with history of myocardial infarction without history of CABG    h/o MI in 1997, s/p PCI x3 stents in 1997, 2 stents in 2000   Essential hypertension    GAD (generalized anxiety disorder)    GERD (gastroesophageal reflux disease)    Mixed hyperlipidemia    Myocardial infarction 1997   Nonrheumatic aortic (valve) stenosis    S/P coronary artery stent placement     Past Surgical History:  Procedure Laterality Date   ABDOMINAL HYSTERECTOMY  1988   LAPAROSCOPIC GASTROTOMY W/ REPAIR OF ULCER  1992   LAPAROSCOPIC OOPHERECTOMY     PERCUTANEOUS CORONARY STENT INTERVENTION (PCI-S)     h/o MI in 1997, s/p PCI x3 stents in 1997, 2 stents in 2000   RIGHT/LEFT HEART CATH AND CORONARY ANGIOGRAPHY  N/A 12/26/2022   Procedure: RIGHT/LEFT HEART CATH AND CORONARY ANGIOGRAPHY;  Surgeon: Kathleene Hazel, MD;  Location: [facility] INVASIVE CV LAB;  Service: Cardiovascular;  Laterality: N/A;    Current Outpatient Medications  Medication Sig Dispense Refill   albuterol (VENTOLIN HFA) 108 (90 Base) MCG/ACT inhaler Inhale 1 puff into the lungs every 6 (six) hours as needed for wheezing or shortness of breath.     amLODipine (NORVASC) 5 MG tablet Take 1 tablet (5 mg total) by mouth daily. 90 tablet 3   aspirin EC 81 MG tablet Take  81 mg by mouth at bedtime. Swallow whole.     atorvastatin (LIPITOR) 80 MG tablet Take 80 mg by mouth at bedtime.     benzonatate (TESSALON) 100 MG capsule Take 100 mg by mouth 3 (three) times daily as needed for cough.     Cholecalciferol (VITAMIN D3) 125 MCG (5000 UT) TABS Take 15,000 Units by mouth daily.     clindamycin (CLINDAGEL) 1 % gel Apply topically 2 (two) times daily as needed.     Cyanocobalamin (VITAMIN B-12 IJ) Inject as directed every 30 (thirty) days.     famotidine (PEPCID) 40 MG tablet Take 1 tablet (40 mg total) by mouth at bedtime. 30 tablet 0   fexofenadine (ALLEGRA) 180 MG tablet Take 1 tablet (180 mg total) by mouth daily. 100 tablet 1   fexofenadine-pseudoephedrine (ALLEGRA-D 24) 180-240 MG 24 hr tablet Take 1 tablet by mouth at bedtime.     furosemide (LASIX) 20 MG tablet Take 20 mg by mouth daily as needed for fluid or edema.     ibuprofen (ADVIL) 200 MG tablet Take 200 mg by mouth every 6 (six) hours as needed for mild pain or moderate pain (Arthritis).     LORazepam (ATIVAN) 0.5 MG tablet Take 0.5 mg by mouth at bedtime.     meclizine (ANTIVERT) 25 MG tablet Take 1 tablet (25 mg total) by mouth 3 (three) times daily as needed for dizziness. 90 tablet 0   nitroGLYCERIN (NITROSTAT) 0.4 MG SL tablet Place 0.4 mg under the tongue every 5 (five) minutes as needed for chest pain.     omeprazole (PRILOSEC) 40 MG capsule Take 30- 60 min before your first and last meals of the day     No current facility-administered medications for this visit.    Allergies  Allergen Reactions   Erythromycin Other (See Comments)    Jaundice   Levocetirizine Shortness Of Breath    "choking, can't breathe"   Rosuvastatin Nausea And Vomiting    Other reaction(s): Abdominal Pain, Bleeding, Constipation, Cough, Dizziness   Fosamax [Alendronate] Other (See Comments)    Muscle spasm     Social History   Socioeconomic History   Marital status: Divorced    Spouse name: Not on file    Number of children: 1   Years of education: Not on file   Highest education level: Not on file  Occupational History   Occupation: Retired-Ran a Science writer  Tobacco Use   Smoking status: Every Day    Packs/day: 1.00    Years: 55.00    Additional pack years: 0.00    Total pack years: 55.00    Types: Cigarettes    Start date: 03/19/1963    Passive exposure: Never   Smokeless tobacco: Never   Tobacco comments:    smokes 5-10 cigarettes per day [facility] 12/31/21  Vaping Use   Vaping Use: Never used  Substance and Sexual Activity   Alcohol  use: Not on file   Drug use: Not on file   Sexual activity: Not on file  Other Topics Concern   Not on file  Social History Narrative   Not on file   Social Determinants of Health   Financial Resource Strain: Not on file  Food Insecurity: Not on file  Transportation Needs: Not on file  Physical Activity: Not on file  Stress: Not on file  Social Connections: Not on file  Intimate Partner Violence: Not on file    Family History  Problem Relation Age of Onset   Depression Mother    Diabetes Mother    Heart failure Mother    Heart attack Mother 54   Breast cancer Mother    Heart attack Father 6   Heart attack Sister    Diabetes Brother    Depression Brother    Heart attack Brother 50   Heart attack Daughter     Review of Systems:  As stated in the HPI and otherwise negative.   BP 124/70   Pulse 79   Ht 5\' 5"  (1.651 m)   Wt 102.7 kg   SpO2 99%   BMI 37.67 kg/m   Physical Examination:  General: Well developed, well nourished, NAD  HEENT: OP clear, mucus membranes moist  SKIN: warm, dry. No rashes. Neuro: No focal deficits  Musculoskeletal: Muscle strength 5/5 all ext  Psychiatric: Mood and affect normal  Neck: No JVD, no carotid bruits, no thyromegaly, no lymphadenopathy.  Lungs:Clear bilaterally, no wheezes, rhonci, crackles Cardiovascular: Regular rate and rhythm. Harsh systolic murmur.  Abdomen:Soft. Bowel sounds  present. Non-tender.  Extremities: No lower extremity edema. Pulses are 2 + in the bilateral DP/PT.  EKG:  EKG is not ordered today. The ekg ordered today demonstrates    Echo 11/18/22:  1. Left ventricular ejection fraction, by estimation, is 60 to 65%. The  left ventricle has normal function. The left ventricle has no regional  wall motion abnormalities. There is mild concentric left ventricular  hypertrophy. Diastolic function  indeterminant due to moderate to severe MAC.   2. Right ventricular systolic function is normal. The right ventricular  size is normal. Mildly increased right ventricular wall thickness.   3. Left atrial size was mildly dilated.   4. The mitral valve is degenerative. Trivial mitral valve regurgitation.  No evidence of mitral stenosis. Moderate to severe mitral annular  calcification.   5. The aortic valve is tricuspid. There is severe calcifcation of the  aortic valve. There is severe thickening of the aortic valve. Aortic valve  regurgitation is mild. Moderate aortic valve stenosis. Aortic valve area,  by VTI measures 0.89 cm. Aortic  valve mean gradient measures 24.0 mmHg. Aortic valve Vmax measures 3.37  m/s. DI 0.27.   6. There is borderline dilatation of the aortic root, measuring 38 mm.   7. The inferior vena cava is normal in size with greater than 50%  respiratory variability, suggesting right atrial pressure of 3 mmHg.   Comparison(s): No prior Echocardiogram.   FINDINGS   Left Ventricle: Left ventricular ejection fraction, by estimation, is 60  to 65%. The left ventricle has normal function. The left ventricle has no  regional wall motion abnormalities. 3D left ventricular ejection fraction  analysis performed but not  reported based on interpreter judgement due to suboptimal tracking. The  left ventricular internal cavity size was normal in size. There is mild  concentric left ventricular hypertrophy. Diastolic function indeterminant  due to  moderate to  severe MAC.   Right Ventricle: The right ventricular size is normal. Mildly increased  right ventricular wall thickness. Right ventricular systolic function is  normal.   Left Atrium: Left atrial size was mildly dilated.   Right Atrium: Right atrial size was normal in size.   Pericardium: There is no evidence of pericardial effusion.   Mitral Valve: The mitral valve is degenerative in appearance. Moderate to  severe mitral annular calcification. Trivial mitral valve regurgitation.  No evidence of mitral valve stenosis.   Tricuspid Valve: The tricuspid valve is normal in structure. Tricuspid  valve regurgitation is trivial.   Aortic Valve: DI 0.27. The aortic valve is tricuspid. There is severe  calcifcation of the aortic valve. There is severe thickening of the aortic  valve. Aortic valve regurgitation is mild. Aortic regurgitation PHT  measures 503 msec. Moderate aortic  stenosis is present. Aortic valve mean gradient measures 24.0 mmHg. Aortic  valve peak gradient measures 45.4 mmHg. Aortic valve area, by VTI measures  0.89 cm.   Pulmonic Valve: The pulmonic valve was not well visualized. Pulmonic valve  regurgitation is not visualized.   Aorta: The aortic root is normal in size and structure. There is  borderline dilatation of the aortic root, measuring 38 mm.   Venous: The inferior vena cava is normal in size with greater than 50%  respiratory variability, suggesting right atrial pressure of 3 mmHg.   IAS/Shunts: The atrial septum is grossly normal.     LEFT VENTRICLE  PLAX 2D  LVIDd:         3.86 cm   Diastology  LVIDs:         2.63 cm   LV e' medial:    5.22 cm/s  LV PW:         1.38 cm   LV E/e' medial:  11.4  LV IVS:        1.19 cm   LV e' lateral:   6.20 cm/s  LVOT diam:     2.00 cm   LV E/e' lateral: 9.6  LV SV:         65  LV SV Index:   31  LVOT Area:     3.14 cm                             3D Volume EF:                           3D EF:         51 %                           LV EDV:       153 ml                           LV ESV:       74 ml                           LV SV:        79 ml   RIGHT VENTRICLE  RV Basal diam:  3.89 cm  RV Mid diam:    2.42 cm  RV S prime:     14.80 cm/s  TAPSE (M-mode): 3.3 cm   LEFT ATRIUM  Index        RIGHT ATRIUM           Index  LA diam:        2.50 cm 1.20 cm/m   RA Area:     17.40 cm  LA Vol (A2C):   80.3 ml 38.54 ml/m  RA Volume:   47.60 ml  22.85 ml/m  LA Vol (A4C):   84.0 ml 40.31 ml/m  LA Biplane Vol: 83.0 ml 39.83 ml/m   AORTIC VALVE  AV Area (Vmax):    0.84 cm  AV Area (Vmean):   0.83 cm  AV Area (VTI):     0.89 cm  AV Vmax:           337.00 cm/s  AV Vmean:          226.000 cm/s  AV VTI:            0.731 m  AV Peak Grad:      45.4 mmHg  AV Mean Grad:      24.0 mmHg  LVOT Vmax:         90.60 cm/s  LVOT Vmean:        59.500 cm/s  LVOT VTI:          0.206 m  LVOT/AV VTI ratio: 0.28  AI PHT:            503 msec    AORTA  Ao Root diam: 3.80 cm  Ao Asc diam:  3.60 cm   MITRAL VALVE  MV Area (PHT): 2.56 cm    SHUNTS  MV Decel Time: 296 msec    Systemic VTI:  0.21 m  MV E velocity: 59.50 cm/s  Systemic Diam: 2.00 cm  MV A velocity: 78.10 cm/s  MV E/A ratio:  0.76   Cardiac CT 01/02/23: Valve Morphology: Tricuspid aortic valve with moderate diffuse calcifications. Leaflets are moderately restricted in systole. AVA 1.44 cm2   Aortic Valve Calcium score: 829   Aortic annular dimension:   Phase assessed: 15%   Annular area: 363 mm2   Annular perimeter: 68.6 mm   Max diameter: 23.5 mm   Min diameter: 20.3 mm   Annular and subannular calcification: None.   Membranous septum length: 6.1 mm   Optimal coplanar projection: LAO 5 CAU 4   Coronary Artery Height above Annulus:   Left Main: 5.5 mm   Right Coronary: 15.5 mm   Sinus of Valsalva Measurements:   Non-coronary: 34 mm   Right-coronary: 34 mm   Left-coronary: 34 mm   Sinus of  Valsalva Height:   Non-coronary: 25.8 mm   Right-coronary: 15.5 mm   Left-coronary: 18.0 mm   Sinotubular Junction: 27 mm   Ascending Thoracic Aorta: 33 mm   Coronary Arteries: Normal coronary origin. Right dominance. The study was performed without use of NTG and is insufficient for plaque evaluation. Please refer to recent cardiac catheterization for coronary assessment.   Cardiac Morphology:   Right Atrium: Right atrial size is within normal limits.   Right Ventricle: The right ventricular cavity is within normal limits.   Left Atrium: Left atrial size is normal in size with no left atrial appendage filling defect.   Left Ventricle: The ventricular cavity size is within normal limits. Inferoapical akinesis noted.   Pulmonary arteries: Normal in size without proximal filling defect.   Pulmonary veins: Normal pulmonary venous drainage.   Pericardium: Normal thickness with no significant effusion or calcium present.   Mitral Valve: The mitral  valve is normal structure without significant calcification.   Extra-cardiac findings: See attached radiology report for non-cardiac structures.   IMPRESSION: 1. Moderate aortic valve calcifications. Aortic valve calcium score 829.   2. Annular measurements support a 23 mm S3 TAVR or 26 mm Evolut Pro.   3. No significant annular or subannular calcifications.   4. Shallow LCA height at 5.5 mm. Large sinuses noted. Structural heart team discussion recommended.   5. Optimal Fluoroscopic Angle for Delivery: LAO 5 CAU 4   6. Inferoapical akinesis noted.   CTA CHEST FINDINGS   Cardiovascular: Normal heart size. No pericardial effusion. Aortic valve thickening and calcifications. Normal caliber thoracic aorta with moderate atherosclerotic disease. Standard three-vessel aortic arch. Severe narrowing of the left subclavian artery secondary to calcified plaque. Severe three-vessel coronary artery calcifications.    Mediastinum/Nodes: Esophagus and thyroid are unremarkable. No enlarged lymph nodes seen in the chest.   Lungs/Pleura: Central airways are patent. 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 No consolidation, pleural effusion or pneumothorax.   Musculoskeletal: No chest wall abnormality. No acute or significant osseous findings.   CTA ABDOMEN AND PELVIS FINDINGS   Hepatobiliary: No focal liver abnormality is seen. No gallstones, gallbladder wall thickening, or biliary dilatation.   Pancreas: Cystic lesion of the uncinate process of the pancreas measuring 11 mm on series 6, image 146. No main pancreatic duct dilation.   Spleen: Normal in size without focal abnormality.   Adrenals/Urinary Tract: Indeterminate right adrenal gland nodule measuring 2.8 cm. Left adrenal gland is unremarkable. No hydronephrosis or nephrolithiasis. Bladder is unremarkable.   Stomach/Bowel: Stomach is within normal limits. Appendix appears normal. Diverticulosis. No evidence of bowel wall thickening, distention, or inflammatory changes.   Vascular/lymphatic: Focal dilation of the infrarenal abdominal aorta measuring up to 2.0 cm, not technically aneurysmal. Moderate atherosclerotic disease of the abdominal aorta. Moderate narrowing of the right renal artery due to calcified plaque, otherwise no significant stenosis. No enlarged lymph nodes seen in the abdomen or pelvis.   Reproductive: Status post hysterectomy. No adnexal masses.   Other: Moderate fat containing ventral abdominal hernia. No abdominopelvic ascites.   Musculoskeletal: No acute or significant osseous findings.   VASCULAR MEASUREMENTS PERTINENT TO TAVR:   AORTA:   Minimal Aortic Diameter-12.5 mm   Severity of Aortic Calcification-moderate   RIGHT PELVIS:   Right Common Iliac Artery -   Minimal Diameter-4.6 mm   Tortuosity-severe   Calcification-moderate   Right  External Iliac Artery -   Minimal Diameter-6.3 mm   Tortuosity-mild   Calcification-none   Right Common Femoral Artery -   Minimal Diameter-7.4 mm   Tortuosity-none   Calcification-mild   LEFT PELVIS:   Left Common Iliac Artery -   Minimal Diameter-8.0 mm   Tortuosity-mild   Calcification-moderate   Left External Iliac Artery -   Minimal Diameter-7.0 mm   Tortuosity-mild   Calcification-none   Left Common Femoral Artery -   Minimal Diameter-5.6 mm   Tortuosity-mild   Calcification-mild   Review of the MIP images confirms the above findings.   IMPRESSION: Vascular:   1. Vascular findings and measurements pertinent to potential TAVR procedure, as detailed above. 2. Thickening and calcification of the aortic valve, compatible with reported clinical history of aortic stenosis. 3. Moderate aortoiliac atherosclerosis. Severe three-vessel coronary artery calcifications.   Nonvascular:   1. Indeterminate right adrenal gland nodule measuring 2.8 cm. Recommend adrenal protocol CT for further evaluation. 2. 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 Recommend follow up pre and post contrast MRI/MRCP or pancreatic protocol CT in 2 years.  Cardiac cath 12/26/22:    Prox RCA to Mid RCA lesion is 100% stenosed.   3rd LPL lesion is 70% stenosed.   LPAV lesion is 90% stenosed.   Prox LAD to Mid LAD lesion is 50% stenosed.   1st Diag lesion is 50% stenosed.   The LAD is a large caliber vessel that courses to the apex. The stented segment of the mid LAD at the takeoff of the Diagonal branch is patent. The bifurcation stenting of the LAD and Diagonal is patent. There is moderate restenosis in the LAD and Diagonal stents but this does not appear to be flow limiting.    The Circumflex is a large dominant vessel. The proximal and mid Circumflex is patent with mild plaque disease. The stented segment of the  distal AV groove Circumflex leading into the most distal obtuse marginal branch has severe stent restenosis throughout. There is also diffuse disease in the small caliber obtuse marginal branch beyond the stent. This is not favorable for PCI.    The RCA is a moderate caliber non-dominant vessel with chronic total occlusion of the proximal vessel. The mid and distal RCA fills from left to right collaterals.    She is known to have paradoxical low flow/low gradient severe aortic stenosis by echo. Peak to peak gradient on cath today 10 mmHg.    Recommendations: Medical management of CAD. Smoking cessation. Continue TAVR workup.    Diagnostic Dominance: Co-dominant Left Anterior Descending  Prox LAD to Mid LAD lesion is 50% stenosed. The lesion was previously treated using a bare metal stent over 2 years ago.    First Diagonal Branch  1st Diag lesion is 50% stenosed. The lesion was previously treated using a bare metal stent over 2 years ago.    Left Circumflex    Third Left Posterolateral Branch  3rd LPL lesion is 70% stenosed.    Left Posterior Atrioventricular Artery  LPAV lesion is 90% stenosed. The lesion was previously treated using a stent (unknown type) over 2 years ago.    Right Coronary Artery  Vessel is moderate in size.  Prox RCA to Mid RCA lesion is 100% stenosed. The lesion is chronically occluded.    Right Posterior Descending Artery  Collaterals  RPDA filled by collaterals from 2nd Sept.      Intervention   No interventions have been documented.   Coronary Diagrams  Diagnostic Dominance: Co-dominant  Intervention   Implants   No implant documentation for this case.   Syngo Images   Show images for CARDIAC CATHETERIZATION Images on Long Term Storage   Show images for [place] to Procedure Log  Procedure Log    Hemo Data  Flowsheet Row Most Recent Value  Fick Cardiac Output 7.85 L/min  Fick Cardiac Output Index 3.82 (L/min)/BSA   Aortic Mean Gradient 0 mmHg  Aortic Peak Gradient 0 mmHg  Aortic Valve Area 1.64  Aortic Value Area Index 0.8 cm2/BSA  RA A Wave 12 mmHg  RA V Wave 8 mmHg  RA Mean 4 mmHg  RV Systolic Pressure 36 mmHg  RV Diastolic Pressure 2 mmHg  RV EDP 10 mmHg  PA Systolic Pressure 36 mmHg  PA Diastolic Pressure 8 mmHg  PA Mean 21 mmHg  PW A Wave 15 mmHg  PW V Wave 13  mmHg  PW Mean 7 mmHg  AO Systolic Pressure 140 mmHg  AO Diastolic Pressure 64 mmHg  AO Mean 91 mmHg  LV Systolic Pressure 159 mmHg  LV Diastolic Pressure 9 mmHg  LV EDP 13 mmHg  AOp Systolic Pressure 146 mmHg  AOp Diastolic Pressure 68 mmHg  AOp Mean Pressure 97 mmHg  LVp Systolic Pressure 166 mmHg  LVp Diastolic Pressure 92 mmHg  LVp EDP Pressure 22 mmHg  QP/QS 1  TPVR Index 5.5 HRUI  TSVR Index 22.79 HRUI  PVR SVR Ratio 0.17  TPVR/TSVR Ratio 0.24    Recent Labs: 12/22/2022: BUN 23; Creatinine, Ser 1.22; Platelets 264 12/26/2022: Hemoglobin 10.9; Potassium 3.9; Sodium 140   Lipid Panel No results found for: "CHOL", "TRIG", "HDL", "CHOLHDL", "VLDL", "LDLCALC", "LDLDIRECT"   Wt Readings from Last 3 Encounters:  01/12/23 102.7 kg  12/26/22 99.8 kg  12/22/22 102 kg    Assessment and Plan:   1. Moderately Severe Aortic Valve Stenosis: Based on findings on her cardiac CT and echo, her AS appears to be moderate to moderately severe at this time. We have reviewed this as a team and the entire structural heart team agrees that her AS is not yet severe. NYHA class 2 symptoms.  She is at high risk for a pacemaker when we elect to proceed with TAVR given risk for high grade heart block following TAVR (baseline RBBB, LAFB and Mobitz 1 AV block). Her left main height is low and there is risk of occlusion of the left main with TAVR although her sinuses seem to be big enough that this may not be an issue.   I have reviewed the natural history of aortic stenosis with the patient and their family members  who are present today. We  have discussed the limitations of medical therapy and the poor prognosis associated with symptomatic aortic stenosis. We have reviewed potential treatment options, including palliative medical therapy, conventional surgical aortic valve replacement, and transcatheter aortic valve replacement. We discussed treatment options in the context of the patient's specific comorbid medical conditions.   At this time, we will continue to follow her AS. Will consider her for the Progress trial continuing access for moderate AS. She seems to be feeing better overall. Repeat echo in August 2024. She will call back if she has worsening of her symptoms.     Labs/ tests ordered today include:   Orders Placed This Encounter  Procedures   ECHOCARDIOGRAM COMPLETE   Disposition:   F/U with me in 6 months.   Signed, Verne Carrow, MD, [facility] 01/12/2023 2:14 PM    [facility] [address] [place], [place], [place]  [address] Phone: [phone]; Fax: [phone]

## 2023-02-09 ENCOUNTER — Other Ambulatory Visit (HOSPITAL_BASED_OUTPATIENT_CLINIC_OR_DEPARTMENT_OTHER): Payer: Self-pay | Admitting: Family

## 2023-02-09 DIAGNOSIS — R42 Dizziness and giddiness: Secondary | ICD-10-CM

## 2023-02-12 ENCOUNTER — Ambulatory Visit (INDEPENDENT_AMBULATORY_CARE_PROVIDER_SITE_OTHER): Payer: 59 | Admitting: Family

## 2023-02-12 ENCOUNTER — Encounter (HOSPITAL_BASED_OUTPATIENT_CLINIC_OR_DEPARTMENT_OTHER): Payer: Self-pay | Admitting: Family

## 2023-02-12 VITALS — BP 140/84 | HR 82 | Ht 65.0 in | Wt 228.2 lb

## 2023-02-12 DIAGNOSIS — I10 Essential (primary) hypertension: Secondary | ICD-10-CM | POA: Diagnosis not present

## 2023-02-12 DIAGNOSIS — I350 Nonrheumatic aortic (valve) stenosis: Secondary | ICD-10-CM

## 2023-02-12 DIAGNOSIS — E785 Hyperlipidemia, unspecified: Secondary | ICD-10-CM | POA: Diagnosis not present

## 2023-02-12 DIAGNOSIS — I452 Bifascicular block: Secondary | ICD-10-CM

## 2023-02-12 DIAGNOSIS — Z6837 Body mass index (BMI) 37.0-37.9, adult: Secondary | ICD-10-CM

## 2023-02-12 DIAGNOSIS — R42 Dizziness and giddiness: Secondary | ICD-10-CM

## 2023-02-12 DIAGNOSIS — I25118 Atherosclerotic heart disease of native coronary artery with other forms of angina pectoris: Secondary | ICD-10-CM

## 2023-02-12 NOTE — Progress Notes (Signed)
Office Visit    Patient Name: Jenna Allen Date of Encounter: 02/12/2023  PCP:  Vivien Presto, MD   [facility]  Cardiologist:  Dina Rich, MD  Advanced Practice Provider:  No care team member to display Electrophysiologist:  None     Chief Complaint    Jenna Allen is a 77 y.o. female presents today for dizziness follow up   Past Medical History    Past Medical History:  Diagnosis Date   Bilateral carotid bruits    Coronary artery disease with history of myocardial infarction without history of CABG    h/o MI in 1997, s/p PCI x3 stents in 1997, 2 stents in 2000   Essential hypertension    GAD (generalized anxiety disorder)    GERD (gastroesophageal reflux disease)    Mixed hyperlipidemia    Myocardial infarction (HCC) 1997   Nonrheumatic aortic (valve) stenosis    S/P coronary artery stent placement    Past Surgical History:  Procedure Laterality Date   ABDOMINAL HYSTERECTOMY  1988   LAPAROSCOPIC GASTROTOMY W/ REPAIR OF ULCER  1992   LAPAROSCOPIC OOPHERECTOMY     PERCUTANEOUS CORONARY STENT INTERVENTION (PCI-S)     h/o MI in 1997, s/p PCI x3 stents in 1997, 2 stents in 2000   RIGHT/LEFT HEART CATH AND CORONARY ANGIOGRAPHY N/A 12/26/2022   Procedure: RIGHT/LEFT HEART CATH AND CORONARY ANGIOGRAPHY;  Surgeon: Kathleene Hazel, MD;  Location: [facility];  Service: Cardiovascular;  Laterality: N/A;    Allergies  Allergies  Allergen Reactions   Erythromycin Other (See Comments)    Jaundice   Levocetirizine Shortness Of Breath    "choking, can't breathe"   Rosuvastatin Nausea And Vomiting    Other reaction(s): Abdominal Pain, Bleeding, Constipation, Cough, Dizziness   Fosamax [Alendronate] Other (See Comments)    Muscle spasm     History of Present Illness    Jenna Allen is a 77 y.o. female with a hx of CAD (MI in 1997 s/p PCI X3, 2 stents in 2000), hyperlipidemia, aortic stenosis carotid bruit (02/2021  carotid duplex no stenosis), hypertension, B12 deficiency last seen 01/12/23.  Echocardiogram 09/2020 LVEF 60%, moderate aortic stenosis with mean gradient of 24. Prior Myoview 12/2020 at [facility] with no ischemia.  Previously stopped amlodipine, Imdur, carvedilol, irbesartan due to orthostatic dizziness.    Saw Dr. Wyline Mood 06/13/2022 noting 3-week history of dizziness with standing overall at improved off blood pressure medicines and was recommended to increase hydration.  At visit 10/03/22 she noted dizziness consistent with vertigo and recommended to start meclizine.  Amlodipine 5 mg daily was added for elevated blood pressure.  14-day ZIO placed in clinic revealed predominantly normal sinus rhythm 73 bpm, 6 expos SVT fastest 5 beats at 160 bpm and longest 11 beats at 135 bpm.  She had one 3.2-second pause at 11:38 AM.  The monitor reported second-degree AV block however reviewed with MD and was blocked PAC in pattern of bigeminy.  Discussed with Dr. Wyline Mood recommended for monitoring.  At visit 11/14/2022 noted her dizziness had resolved after starting Allegra at the recommendation of her eye doctor.  The meclizine did help with symptoms.  Echocardiogram ordered for monitoring of aortic stenosis.  Echocardiogram 11/18/2022 normal LVEF 60 to 65%, no RWMA, mild LVH, mild aortic regurgitation, moderate aortic valve stenosis (mean gradient 24 mmHg), borderline dilation of aortic root 38 mm, severe calcification of mitral and aortic valves.  Referred to structural heart team due  to persistent dizziness.  Cardiac cath 12/26/2022 with patent LAD/diagonal stent with moderate restenosis, patent distal circumflex stented segment with severe restenosis not favorable for PCI.  Nondominant RCA chronically occluded and fills from left to right collaterals.  Cardiac CTA with AV calcium score of 829.  Aortic calcinosis felt to be moderate based on appearance of CT with AVA 1.44 cm2.   Last saw Dr. Clifton James 01/12/2023 with NYHA II  symptoms from her moderate to moderately severe aortic stenosis.  Plan to continue to follow and consider for progress trial with continuing axis for moderate AAS with plan for repeat echo 04/2023.  She presents today for follow-up independently. Reports intermittent episodes of dizziness over the last few weeks even with Allegra. She is worried the Joyce Copa is causing her to feel more short of breath as per her report this has done it in the past. Does have Claritin to take if needed.  However also noted to be volume up with increased weight.  Taking her fluid pill once per week or every other week. Last took last Thursday.  No chest pain, near-syncope, syncope.  Exertional dyspnea stable at baseline.  EKGs/Labs/Other Studies Reviewed:   The following studies were reviewed today:  Cardiac Studies & Procedures   CARDIAC CATHETERIZATION  CARDIAC CATHETERIZATION 12/26/2022  Narrative   Prox RCA to Mid RCA lesion is 100% stenosed.   3rd LPL lesion is 70% stenosed.   LPAV lesion is 90% stenosed.   Prox LAD to Mid LAD lesion is 50% stenosed.   1st Diag lesion is 50% stenosed.  The LAD is a large caliber vessel that courses to the apex. The stented segment of the mid LAD at the takeoff of the Diagonal branch is patent. The bifurcation stenting of the LAD and Diagonal is patent. There is moderate restenosis in the LAD and Diagonal stents but this does not appear to be flow limiting.  The Circumflex is a large dominant vessel. The proximal and mid Circumflex is patent with mild plaque disease. The stented segment of the distal AV groove Circumflex leading into the most distal obtuse marginal branch has severe stent restenosis throughout. There is also diffuse disease in the small caliber obtuse marginal branch beyond the stent. This is not favorable for PCI.  The RCA is a moderate caliber non-dominant vessel with chronic total occlusion of the proximal vessel. The mid and distal RCA fills from left to  right collaterals.  She is known to have paradoxical low flow/low gradient severe aortic stenosis by echo. Peak to peak gradient on cath today 10 mmHg.  Recommendations: Medical management of CAD. Smoking cessation. Continue TAVR workup.  Findings Coronary Findings Diagnostic  Dominance: Co-dominant  Left Anterior Descending Prox LAD to Mid LAD lesion is 50% stenosed. The lesion was previously treated using a bare metal stent over 2 years ago.  First Diagonal Branch 1st Diag lesion is 50% stenosed. The lesion was previously treated using a bare metal stent over 2 years ago.  Left Circumflex  Third Left Posterolateral Branch 3rd LPL lesion is 70% stenosed.  Left Posterior Atrioventricular Artery LPAV lesion is 90% stenosed. The lesion was previously treated using a stent (unknown type) over 2 years ago.  Right Coronary Artery Vessel is moderate in size. Prox RCA to Mid RCA lesion is 100% stenosed. The lesion is chronically occluded.  Right Posterior Descending Artery Collaterals RPDA filled by collaterals from 2nd Sept.  Intervention  No interventions have been documented.     ECHOCARDIOGRAM  ECHOCARDIOGRAM COMPLETE 11/18/2022  Narrative ECHOCARDIOGRAM REPORT    Patient Name:   JESMINE DURRAH Date of Exam: 11/18/2022 Medical Rec #:  [id number]       Height:       65.0 in Accession #:    [id number]      Weight:       226.0 lb Date of Birth:  [birth year]       BSA:          2.084 m Patient Age:    76 years        BP:           130/70 mmHg Patient Gender: F               HR:           66 bpm. Exam Location:  Outpatient  Procedure: 2D Echo, 3D Echo, Color Doppler, Cardiac Doppler and Strain Analysis  Indications:    Aortic Stenosis  History:        Patient has no prior history of Echocardiogram examinations. Risk Factors:Current Smoker and Hypertension. Aortic Stenosis.  Sonographer:    Jeryl Columbia RDCS Referring Phys: [id number] Rashawna Scoles S  Ryleigh Esqueda  IMPRESSIONS   1. Left ventricular ejection fraction, by estimation, is 60 to 65%. The left ventricle has normal function. The left ventricle has no regional wall motion abnormalities. There is mild concentric left ventricular hypertrophy. Diastolic function indeterminant due to moderate to severe MAC. 2. Right ventricular systolic function is normal. The right ventricular size is normal. Mildly increased right ventricular wall thickness. 3. Left atrial size was mildly dilated. 4. The mitral valve is degenerative. Trivial mitral valve regurgitation. No evidence of mitral stenosis. Moderate to severe mitral annular calcification. 5. The aortic valve is tricuspid. There is severe calcifcation of the aortic valve. There is severe thickening of the aortic valve. Aortic valve regurgitation is mild. Moderate aortic valve stenosis. Aortic valve area, by VTI measures 0.89 cm. Aortic valve mean gradient measures 24.0 mmHg. Aortic valve Vmax measures 3.37 m/s. DI 0.27. 6. There is borderline dilatation of the aortic root, measuring 38 mm. 7. The inferior vena cava is normal in size with greater than 50% respiratory variability, suggesting right atrial pressure of 3 mmHg.  Comparison(s): No prior Echocardiogram.  FINDINGS Left Ventricle: Left ventricular ejection fraction, by estimation, is 60 to 65%. The left ventricle has normal function. The left ventricle has no regional wall motion abnormalities. 3D left ventricular ejection fraction analysis performed but not reported based on interpreter judgement due to suboptimal tracking. The left ventricular internal cavity size was normal in size. There is mild concentric left ventricular hypertrophy. Diastolic function indeterminant due to moderate to severe MAC.  Right Ventricle: The right ventricular size is normal. Mildly increased right ventricular wall thickness. Right ventricular systolic function is normal.  Left Atrium: Left atrial size was  mildly dilated.  Right Atrium: Right atrial size was normal in size.  Pericardium: There is no evidence of pericardial effusion.  Mitral Valve: The mitral valve is degenerative in appearance. Moderate to severe mitral annular calcification. Trivial mitral valve regurgitation. No evidence of mitral valve stenosis.  Tricuspid Valve: The tricuspid valve is normal in structure. Tricuspid valve regurgitation is trivial.  Aortic Valve: DI 0.27. The aortic valve is tricuspid. There is severe calcifcation of the aortic valve. There is severe thickening of the aortic valve. Aortic valve regurgitation is mild. Aortic regurgitation PHT measures 503 msec. Moderate aortic stenosis is present. Aortic valve mean gradient  measures 24.0 mmHg. Aortic valve peak gradient measures 45.4 mmHg. Aortic valve area, by VTI measures 0.89 cm.  Pulmonic Valve: The pulmonic valve was not well visualized. Pulmonic valve regurgitation is not visualized.  Aorta: The aortic root is normal in size and structure. There is borderline dilatation of the aortic root, measuring 38 mm.  Venous: The inferior vena cava is normal in size with greater than 50% respiratory variability, suggesting right atrial pressure of 3 mmHg.  IAS/Shunts: The atrial septum is grossly normal.   LEFT VENTRICLE PLAX 2D LVIDd:         3.86 cm   Diastology LVIDs:         2.63 cm   LV e' medial:    5.22 cm/s LV PW:         1.38 cm   LV E/e' medial:  11.4 LV IVS:        1.19 cm   LV e' lateral:   6.20 cm/s LVOT diam:     2.00 cm   LV E/e' lateral: 9.6 LV SV:         65 LV SV Index:   31 LVOT Area:     3.14 cm  3D Volume EF: 3D EF:        51 % LV EDV:       153 ml LV ESV:       74 ml LV SV:        79 ml  RIGHT VENTRICLE RV Basal diam:  3.89 cm RV Mid diam:    2.42 cm RV S prime:     14.80 cm/s TAPSE (M-mode): 3.3 cm  LEFT ATRIUM             Index        RIGHT ATRIUM           Index LA diam:        2.50 cm 1.20 cm/m   RA Area:     17.40  cm LA Vol (A2C):   80.3 ml 38.54 ml/m  RA Volume:   47.60 ml  22.85 ml/m LA Vol (A4C):   84.0 ml 40.31 ml/m LA Biplane Vol: 83.0 ml 39.83 ml/m AORTIC VALVE AV Area (Vmax):    0.84 cm AV Area (Vmean):   0.83 cm AV Area (VTI):     0.89 cm AV Vmax:           337.00 cm/s AV Vmean:          226.000 cm/s AV VTI:            0.731 m AV Peak Grad:      45.4 mmHg AV Mean Grad:      24.0 mmHg LVOT Vmax:         90.60 cm/s LVOT Vmean:        59.500 cm/s LVOT VTI:          0.206 m LVOT/AV VTI ratio: 0.28 AI PHT:            503 msec  AORTA Ao Root diam: 3.80 cm Ao Asc diam:  3.60 cm  MITRAL VALVE MV Area (PHT): 2.56 cm    SHUNTS MV Decel Time: 296 msec    Systemic VTI:  0.21 m MV E velocity: 59.50 cm/s  Systemic Diam: 2.00 cm MV A velocity: 78.10 cm/s MV E/A ratio:  0.76  Laurance Flatten MD Electronically signed by Laurance Flatten MD Signature Date/Time: 11/18/2022/5:18:05 PM    Final    MONITORS  LONG TERM MONITOR (3-14 DAYS) 12/22/2022  Narrative   14 day monitor   Occasional supraventricular ectopy in the form of isolated PACs. Rare couplets, triplets. 6 runs of SVT longest 11 beats   Rare ventricular ectopy in the form of PVCs, couplets, triplets   Isolated 3.2 second pause noted at 1138AM. Mobitz I Wenchebach block and 2:1 av block noted   Symproms correlated with normal sinus rhythm, rare ectopy   Patch Wear Time:  13 days and 18 hours (2024-01-05T15:25:55-0500 to 2024-01-19T10:04:22-0500)  Patient had a min HR of 36 bpm, max HR of 162 bpm, and avg HR of 73 bpm. Predominant underlying rhythm was Sinus Rhythm. First Degree AV Block was present. Bundle Branch Block/IVCD was present. 6 Supraventricular Tachycardia runs occurred, the run with the fastest interval lasting 5 beats with a max rate of 162 bpm, the longest lasting 11 beats with an avg rate of 135 bpm. 1 Pause occurred lasting 3.2 secs (19 bpm). Second Degree AV Block-Mobitz I (Wenckebach) was present.  Wenckebach was detected within +/- 45 seconds of symptomatic patient event(s). Isolated SVEs were occasional (1.4%, 19704), SVE Couplets were rare (<1.0%, 138), and SVE Triplets were rare (<1.0%, 15). Isolated VEs were rare (<1.0%, 6572), VE Couplets were rare (<1.0%, 21), and VE Triplets were rare (<1.0%, 3). Ventricular Bigeminy and Trigeminy were present.   CT SCANS  CT CORONARY MORPH W/CTA COR W/SCORE 01/02/2023  Addendum 01/02/2023  3:00 PM ADDENDUM REPORT: 01/02/2023 14:58  EXAM: OVER-READ INTERPRETATION  CT CHEST  The following report is an over-read performed by radiologist Dr. Jacob Moores [facility] on 01/02/2023. This over-read does not include interpretation of cardiac or coronary anatomy or pathology. The cardiac TAVR interpretation by the cardiologist is attached.  COMPARISON:  None.  FINDINGS: Extracardiac findings will be described separately under dictation for contemporaneously obtained CTA chest, abdomen and pelvis.  IMPRESSION: Please see separate dictation for contemporaneously obtained CTA chest, abdomen and pelvis dated 01/02/2023 for full description of relevant extracardiac findings.   Electronically Signed By: Allegra Lai M.D. On: 01/02/2023 14:58  Narrative CLINICAL DATA:  Severe Aortic Stenosis.  EXAM: Cardiac TAVR CT  TECHNIQUE: A non-contrast, gated CT scan was obtained with axial slices of 3 mm through the heart for aortic valve calcium scoring. A 120 kV retrospective, gated, contrast cardiac scan was obtained. Gantry rotation speed was 250 msecs and collimation was 0.6 mm. Nitroglycerin was not given. The 3D data set was reconstructed in 5% intervals of the 0-95% of the R-R cycle. Systolic and diastolic phases were analyzed on a dedicated workstation using MPR, MIP, and VRT modes. The patient received 100 cc of contrast.  FINDINGS: Image quality: Excellent.  Noise artifact is: Limited.  Valve Morphology:  Tricuspid aortic valve with moderate diffuse calcifications. Leaflets are moderately restricted in systole. AVA 1.44 cm2  Aortic Valve Calcium score: 829  Aortic annular dimension:  Phase assessed: 15%  Annular area: 363 mm2  Annular perimeter: 68.6 mm  Max diameter: 23.5 mm  Min diameter: 20.3 mm  Annular and subannular calcification: None.  Membranous septum length: 6.1 mm  Optimal coplanar projection: LAO 5 CAU 4  Coronary Artery Height above Annulus:  Left Main: 5.5 mm  Right Coronary: 15.5 mm  Sinus of Valsalva Measurements:  Non-coronary: 34 mm  Right-coronary: 34 mm  Left-coronary: 34 mm  Sinus of Valsalva Height:  Non-coronary: 25.8 mm  Right-coronary: 15.5 mm  Left-coronary: 18.0 mm  Sinotubular Junction: 27 mm  Ascending Thoracic Aorta: 33  mm  Coronary Arteries: Normal coronary origin. Right dominance. The study was performed without use of NTG and is insufficient for plaque evaluation. Please refer to recent cardiac catheterization for coronary assessment.  Cardiac Morphology:  Right Atrium: Right atrial size is within normal limits.  Right Ventricle: The right ventricular cavity is within normal limits.  Left Atrium: Left atrial size is normal in size with no left atrial appendage filling defect.  Left Ventricle: The ventricular cavity size is within normal limits. Inferoapical akinesis noted.  Pulmonary arteries: Normal in size without proximal filling defect.  Pulmonary veins: Normal pulmonary venous drainage.  Pericardium: Normal thickness with no significant effusion or calcium present.  Mitral Valve: The mitral valve is normal structure without significant calcification.  Extra-cardiac findings: See attached radiology report for non-cardiac structures.  IMPRESSION: 1. Moderate aortic valve calcifications. Aortic valve calcium score 829.  2. Annular measurements support a 23 mm S3 TAVR or 26 mm Evolut Pro.  3. No  significant annular or subannular calcifications.  4. Shallow LCA height at 5.5 mm. Large sinuses noted. Structural heart team discussion recommended.  5. Optimal Fluoroscopic Angle for Delivery: LAO 5 CAU 4  6. Inferoapical akinesis noted.  Gerri Spore T. Flora Lipps, MD  Electronically Signed: By: Lennie Odor M.D. On: 01/02/2023 14:39           EKG:  EKG is not  ordered today.    Recent Labs: 12/22/2022: BUN 23; Creatinine, Ser 1.22; Platelets 264 12/26/2022: Hemoglobin 10.9; Potassium 3.9; Sodium 140  Recent Lipid Panel No results found for: "CHOL", "TRIG", "HDL", "CHOLHDL", "VLDL", "LDLCALC", "LDLDIRECT"  Home Medications   Current Meds  Medication Sig   albuterol (VENTOLIN HFA) 108 (90 Base) MCG/ACT inhaler Inhale 1 puff into the lungs every 6 (six) hours as needed for wheezing or shortness of breath.   amLODipine (NORVASC) 5 MG tablet Take 1 tablet (5 mg total) by mouth daily.   aspirin EC 81 MG tablet Take 81 mg by mouth at bedtime. Swallow whole.   atorvastatin (LIPITOR) 80 MG tablet Take 80 mg by mouth at bedtime.   benzonatate (TESSALON) 100 MG capsule Take 100 mg by mouth 3 (three) times daily as needed for cough.   Cholecalciferol (VITAMIN D3) 125 MCG (5000 UT) TABS Take 15,000 Units by mouth daily.   clindamycin (CLINDAGEL) 1 % gel Apply topically 2 (two) times daily as needed.   Cyanocobalamin (VITAMIN B-12 IJ) Inject as directed every 30 (thirty) days.   famotidine (PEPCID) 40 MG tablet Take 1 tablet (40 mg total) by mouth at bedtime.   fexofenadine (ALLEGRA) 180 MG tablet Take 1 tablet (180 mg total) by mouth daily.   fexofenadine-pseudoephedrine (ALLEGRA-D 24) 180-240 MG 24 hr tablet Take 1 tablet by mouth at bedtime.   furosemide (LASIX) 20 MG tablet Take 20 mg by mouth daily as needed for fluid or edema.   ibuprofen (ADVIL) 200 MG tablet Take 200 mg by mouth every 6 (six) hours as needed for mild pain or moderate pain (Arthritis).   LORazepam (ATIVAN) 0.5 MG tablet  Take 0.5 mg by mouth at bedtime.   meclizine (ANTIVERT) 25 MG tablet Take 1 tablet (25 mg total) by mouth 3 (three) times daily as needed for dizziness.   nitroGLYCERIN (NITROSTAT) 0.4 MG SL tablet Place 0.4 mg under the tongue every 5 (five) minutes as needed for chest pain.   omeprazole (PRILOSEC) 40 MG capsule Take 30- 60 min before your first and last meals of the day  Review of Systems      All other systems reviewed and are otherwise negative except as noted above.  Physical Exam    VS:  BP (!) 150/80   Pulse 82   Ht 5\' 5"  (1.651 m)   Wt 228 lb 3.2 oz (103.5 kg)   BMI 37.97 kg/m  , BMI Body mass index is 37.97 kg/m.  Wt Readings from Last 3 Encounters:  02/12/23 228 lb 3.2 oz (103.5 kg)  01/12/23 226 lb 6.4 oz (102.7 kg)  12/26/22 220 lb (99.8 kg)     GEN: Well nourished, well developed, in no acute distress. HEENT: normal. Neck: Supple, no JVD, carotid bruits, or masses. Cardiac: RRR, gr 3/6 systolic murmur, no rubs, or gallops. No clubbing, cyanosis, edema.  Radials/PT 2+ and equal bilaterally.  Respiratory:  Respirations regular and unlabored, clear to auscultation bilaterally. GI: Soft, nontender, nondistended. MS: No deformity or atrophy. Skin: Warm and dry, no rash. Neuro:  Strength and sensation are intact. Psych: Normal affect.  Assessment & Plan    Dizziness / Vertigo - Longstanding history of dizziness consistent with vertigo. Also with moderate to moderate-severe AS followed by Dr. Clifton James. Symptoms previously improved with Allegra but she reports dyspnea. Discussed dyspnea would be uncommon side effect of Allegra and she may continue or choose instead to add Claritin. Anticipate dyspnea due to volume overload, management below.   CAD - Stable with no anginal symptoms. No indication for ischemic evaluation.  GDMT includes aspirin, atorvastatin.  No beta-blocker due to bifascicular block.    HLD, LDL goal <70 -  Continue atorvastatin.  Aortic stenosis -  Moderate to severe by echo, CT, LHC 10/2022-12/2022. Following with valve team with plan for repeat echo 04/2023.   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  Prior visit 10/03/22 vertigo like symptoms improved with Meclizine and resolved with addition of Allegra. No near syncope, syncope. Monitor with predominantly NSR one 3.2 second pause at 11am (during waking hours) which was not triggered. As symptoms resolve, will defer further workup at this time.  Previously discussed with Dr. Wyline Mood who agrees with plan to monitor.  HTN - BP well controlled by home monitoring. Mildly elevated in clinic. Recommend Lasix 20mg  daily x 2 days then PRN due to exertional dyspnea, elevated BP, volume overload. Has not taken Lasix in >1 week. Continue current antihypertensive regimen of Amlodipine 5mg  QD.   BMI 37 - Weight loss via diet and exercise encouraged. Discussed the impact being overweight would have on cardiovascular risk. Encouraged to gradually increase physical activity at home.   Disposition: Follow up in 6 month(s) with Dina Rich, MD or APP.  Signed, Alver Sorrow, NP 02/12/2023, 1:32 PM  [facility]

## 2023-02-12 NOTE — Patient Instructions (Addendum)
Medication Instructions:  Your physician has recommended you make the following change in your medication:  Take Lasix 20mg  daily for two days then as needed   If on Wednesday you still feel short of breath, please let us know. You can then trial stopping Allegra and using the as-needed Meclizine. However, your shortness of breath is more likely due to extra fluid.   *If you need a refill on your cardiac medications before your next appointment, please call your pharmacy*   Testing/Procedures: Echocardiogram in August as scheduled.    Follow-Up: At [facility], you and your health needs are our priority.  As part of our continuing mission to provide you with exceptional heart care, we have created designated Provider Care Teams.  These Care Teams include your primary Cardiologist (physician) and Advanced Practice Providers (APPs -  Physician Assistants and Nurse Practitioners) who all work together to provide you with the care you need, when you need it.  We recommend signing up for the patient portal called "MyChart".  Sign up information is provided on this After Visit Summary.  MyChart is used to connect with patients for Virtual Visits (Telemedicine).  Patients are able to view lab/test results, encounter notes, upcoming appointments, etc.  Non-urgent messages can be sent to your provider as well.   To learn more about what you can do with MyChart, go to [email or web address].    Your next appointment:   In 3 months with Dr. Clifton James  AND In 6 months with Dr. Wyline Mood or Alver Sorrow, NP   Other Instructions Recommend trying over the counter Diclofenac gel for knee pain.   Recommend weighing daily and keeping a log. Please call our office if you have weight gain of 2 pounds overnight or 5 pounds in 1 week.   Date  Time Weight

## 2023-02-15 ENCOUNTER — Encounter (HOSPITAL_BASED_OUTPATIENT_CLINIC_OR_DEPARTMENT_OTHER): Payer: Self-pay | Admitting: Family

## 2023-04-22 ENCOUNTER — Telehealth: Payer: Self-pay | Admitting: Cardiology

## 2023-04-22 NOTE — Telephone Encounter (Signed)
Patient is asking that you give her a call back. Please advise 

## 2023-04-23 NOTE — Telephone Encounter (Signed)
Returned call to patient,   Patient states Dr. Clifton James told patient during her last consultation to stay around in the area. She is scheduled to follow up again in August. She is wanting to know how long before she can travel to the beach or [place] since they are greater than one [place] from [place]. No mention in last office visit, note will route to provider for input.

## 2023-04-23 NOTE — Telephone Encounter (Signed)
Returned call to patient, provided MD recommendations. Patient verbalizes understanding.

## 2023-04-23 NOTE — Telephone Encounter (Signed)
Jenna Hazel, MD  You; Marlene Lard, RN32 minutes ago (2:17 PM)   She can travel out of town. Her aortic stenosis will not prevent that. Chris  __________________________________________________________  I spoke w patient and reviewed recommendation.  She is pleased to hear ok to travel.

## 2023-05-26 ENCOUNTER — Ambulatory Visit (HOSPITAL_COMMUNITY): Payer: 59 | Attending: Cardiology

## 2023-05-26 DIAGNOSIS — I350 Nonrheumatic aortic (valve) stenosis: Secondary | ICD-10-CM | POA: Insufficient documentation

## 2023-05-26 LAB — ECHOCARDIOGRAM COMPLETE
AR max vel: 1.02 cm2
AV Area VTI: 1 cm2
AV Area mean vel: 0.99 cm2
AV Mean grad: 24 mmHg
AV Peak grad: 42.5 mmHg
Ao pk vel: 3.26 m/s
Area-P 1/2: 2.61 cm2
MV VTI: 1.68 cm2
P 1/2 time: 740 msec
S' Lateral: 3.1 cm

## 2023-05-27 NOTE — Progress Notes (Signed)
Structural Heart Clinic Note  Chief Complaint  Patient presents with   Follow-up    Aortic stenosis   History of Present Illness: 77 yo female with history of CAD, hyperlipidemia, HTN, ongoing tobacco abuse and aortic stenosis who is here today for follow up. I saw her as a new consult in the structural heart clinic 12/23/22 for further discussion regarding her aortic stenosis and possible TAVR. She had an MI in 1997 and had complex stenting of the LAD and Diagonal branch. She had a stent placed in the left PDA in 2001 and balloon angioplasty of the non-dominant RCA at that time. Cardiac cath in 2007 with patency of the LAD and Circumflex stents. She has been followed for moderate aortic stenosis. She has had dizziness over the past few years which has improved some on Allegra and meclizine however her dizziness with standing persists. Cardiac monitor in January 2024 with Sinus, PACs, PVCs, short runs of SVT, 3.2 second pause with 2:1 AV block. Echo 11/18/22 with LVEF=60-65%. Mild AI. Moderately severe to severe paradoxical low flow/low gradient aortic stenosis (mean gradient 24 mmHg, AVA 0.83 cm2, SVI 31, DI 0.27). Visually her valve appears to have severe stenosis. We proceeded with planning for TAVR. Cardiac cath 12/26/22 with patent LAD/Diagonal stents with moderate restenosis, patent distal Circumflex stented segment with severe restenosis-not favorable for PCI. The non-dominant RCA is chronically occluded proximally and fills from left to right collaterals. Cardiac CTA with AV calcium score of 829. Annular area of 363 mm2. Low left main height at 5.5 mm. Her aortic stenosis is felt to be moderate based on the valve appearance on her the CT with AVA of 1.44 cm2. The annular area is suitable for a 23 mm Edwards valve or 26 mm Medtronic valve. She appears to have femoral access for TAVR. Her case was reviewed at the structural team meeting and her AS was felt to be moderate in early 2024. Echo 05/26/23  with LVEF=60-65%. Moderate aortic stenosis with mean gradient 24 mmHg, AVA 1.0 cm2, SVI 37, DI 0.29.   She is here today for follow up. The patient denies any chest pain, palpitations, lower extremity edema, orthopnea, PND, dizziness, near syncope or syncope. She continues to have her baseline dyspnea. She continues to smoke over a half a pack of cigarettes per day. She has been smoking more than that recently due to the loss of her close friend.   Of note, CTA with right adrenal mass. I reviewed this with her and she states this was present last year. 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   She lives with her son in [place], [place]. She is retired. She owned a Science writer. She goes to the dentist regularly. She does not have dentures or any active dental issues.   Primary Care Physician: Vivien Presto, MD Primary Cardiologist: Dominga Ferry Referring Cardiologist: Dominga Ferry  Past Medical History:  Diagnosis Date   Bilateral carotid bruits    Coronary artery disease with history of myocardial infarction without history of CABG    h/o MI in 1997, s/p PCI x3 stents in 1997, 2 stents in 2000   Essential hypertension    GAD (generalized anxiety disorder)    GERD (gastroesophageal reflux disease)    Mixed hyperlipidemia    Myocardial infarction (HCC) 1997   Nonrheumatic aortic (valve) stenosis    S/P coronary artery stent placement     Past Surgical  History:  Procedure Laterality Date   ABDOMINAL HYSTERECTOMY  1988   LAPAROSCOPIC GASTROTOMY W/ REPAIR OF ULCER  1992   LAPAROSCOPIC OOPHERECTOMY     PERCUTANEOUS CORONARY STENT INTERVENTION (PCI-S)     h/o MI in 1997, s/p PCI x3 stents in 1997, 2 stents in 2000   RIGHT/LEFT HEART CATH AND CORONARY ANGIOGRAPHY N/A 12/26/2022   Procedure: RIGHT/LEFT HEART CATH AND CORONARY ANGIOGRAPHY;  Surgeon: Kathleene Hazel, MD;  Location: [facility] INVASIVE CV LAB;  Service: Cardiovascular;   Laterality: N/A;    Current Outpatient Medications  Medication Sig Dispense Refill   albuterol (VENTOLIN HFA) 108 (90 Base) MCG/ACT inhaler Inhale 1 puff into the lungs every 6 (six) hours as needed for wheezing or shortness of breath.     amLODipine (NORVASC) 5 MG tablet Take 1 tablet (5 mg total) by mouth daily. 90 tablet 3   aspirin EC 81 MG tablet Take 81 mg by mouth at bedtime. Swallow whole.     atorvastatin (LIPITOR) 80 MG tablet Take 80 mg by mouth at bedtime.     benzonatate (TESSALON) 100 MG capsule Take 100 mg by mouth 3 (three) times daily as needed for cough.     Cholecalciferol (VITAMIN D3) 125 MCG (5000 UT) TABS Take 15,000 Units by mouth daily.     clindamycin (CLINDAGEL) 1 % gel Apply topically 2 (two) times daily as needed.     famotidine (PEPCID) 40 MG tablet Take 1 tablet (40 mg total) by mouth at bedtime. 30 tablet 0   fexofenadine (ALLEGRA) 180 MG tablet Take 1 tablet (180 mg total) by mouth daily. 100 tablet 1   fexofenadine-pseudoephedrine (ALLEGRA-D 24) 180-240 MG 24 hr tablet Take 1 tablet by mouth at bedtime.     furosemide (LASIX) 20 MG tablet Take 20 mg by mouth daily as needed for fluid or edema.     ibuprofen (ADVIL) 200 MG tablet Take 200 mg by mouth every 6 (six) hours as needed for mild pain or moderate pain (Arthritis).     LORazepam (ATIVAN) 0.5 MG tablet Take 0.5 mg by mouth at bedtime.     meclizine (ANTIVERT) 25 MG tablet Take 1 tablet (25 mg total) by mouth 3 (three) times daily as needed for dizziness. 90 tablet 0   omeprazole (PRILOSEC) 40 MG capsule Take 30- 60 min before your first and last meals of the day     nitroGLYCERIN (NITROSTAT) 0.4 MG SL tablet Place 1 tablet (0.4 mg total) under the tongue every 5 (five) minutes as needed for chest pain. 25 tablet 3   No current facility-administered medications for this visit.    Allergies  Allergen Reactions   Erythromycin Other (See Comments)    Jaundice   Levocetirizine Shortness Of Breath     "choking, can't breathe"   Rosuvastatin Nausea And Vomiting    Other reaction(s): Abdominal Pain, Bleeding, Constipation, Cough, Dizziness   Fosamax [Alendronate] Other (See Comments)    Muscle spasm     Social History   Socioeconomic History   Marital status: Divorced    Spouse name: Not on file   Number of children: 1   Years of education: Not on file   Highest education level: Not on file  Occupational History   Occupation: Retired-Ran a Science writer  Tobacco Use   Smoking status: Every Day    Current packs/day: 1.00    Average packs/day: 1 pack/day for 60.2 years (60.2 ttl pk-yrs)    Types: Cigarettes    Start  date: 03/19/1963    Passive exposure: Never   Smokeless tobacco: Never   Tobacco comments:    smokes 5-10 cigarettes per day [facility] 12/31/21  Vaping Use   Vaping status: Never Used  Substance and Sexual Activity   Alcohol use: Not on file   Drug use: Not on file   Sexual activity: Not on file  Other Topics Concern   Not on file  Social History Narrative   Not on file   Social Determinants of Health   Financial Resource Strain: Medium Risk (11/15/2022)   Received from [facility], [facility]   Overall Financial Resource Strain (CARDIA)    Difficulty of Paying Living Expenses: Somewhat hard  Food Insecurity: Food Insecurity Present (11/15/2022)   Received from [facility], [facility]   Hunger Vital Sign    Worried About Running Out of Food in the Last Year: Sometimes true    Ran Out of Food in the Last Year: Sometimes true  Transportation Needs: No Transportation Needs (11/15/2022)   Received from [facility], [facility]   PRAPARE - Transportation    Lack of Transportation (Medical): No    Lack of Transportation (Non-Medical): No  Physical Activity: Insufficiently Active (11/15/2022)   Received from [facility], [facility]   Exercise Vital Sign    Days of Exercise per Week: 3 days    Minutes of Exercise per Session: 20 min  Stress: No  Stress Concern Present (11/15/2022)   Received from [facility], [facility] - Occupational Stress Questionnaire    Feeling of Stress : Only a little  Social Connections: Socially Integrated (11/15/2022)   Received from [facility], [facility]   Social Network    How would you rate your social network (family, work, friends)?: Good participation with social networks  Intimate Partner Violence: Not At Risk (11/15/2022)   Received from [facility], [facility]   HITS    Over the last 12 months how often did your partner physically hurt you?: 1    Over the last 12 months how often did your partner insult you or talk down to you?: 1    Over the last 12 months how often did your partner threaten you with physical harm?: 1    Over the last 12 months how often did your partner scream or curse at you?: 1    Family History  Problem Relation Age of Onset   Depression Mother    Diabetes Mother    Heart failure Mother    Heart attack Mother 89   Breast cancer Mother    Heart attack Father 45   Heart attack Sister    Diabetes Brother    Depression Brother    Heart attack Brother 50   Heart attack Daughter     Review of Systems:  As stated in the HPI and otherwise negative.   BP 136/80   Pulse 75   Ht 5\' 5"  (1.651 m)   Wt 101.4 kg   SpO2 99%   BMI 37.21 kg/m   Physical Examination:  General: Well developed, well nourished, NAD  HEENT: OP clear, mucus membranes moist  SKIN: warm, dry. No rashes. Neuro: No focal deficits  Musculoskeletal: Muscle strength 5/5 all ext  Psychiatric: Mood and affect normal  Neck: No JVD, no carotid bruits, no thyromegaly, no lymphadenopathy.  Lungs:Clear bilaterally, no wheezes, rhonci, crackles Cardiovascular: Regular rate and rhythm. Harsh systolic murmur.  Abdomen:Soft. Bowel sounds present. Non-tender.  Extremities: No lower extremity edema. Pulses are 2 + in the bilateral DP/PT.  EKG:  EKG  is not ordered today. The ekg ordered today demonstrates    Echo 05/26/23: 1. Left ventricular ejection fraction, by estimation, is 60 to 65%. The  left ventricle has normal function. The left ventricle has no regional  wall motion abnormalities. Left ventricular diastolic parameters are  indeterminate. The average left  ventricular global longitudinal strain is -22.6 %. The global longitudinal  strain is normal.   2. Right ventricular systolic function is normal. The right ventricular  size is normal. There is normal pulmonary artery systolic pressure.   3. Left atrial size was mildly dilated.   4. The mitral valve is degenerative. Trivial mitral valve regurgitation.  No evidence of mitral stenosis. Moderate to severe mitral annular  calcification.   5. The aortic valve is calcified. There is severe calcifcation of the  aortic valve. There is severe thickening of the aortic valve. Aortic valve  regurgitation is trivial. Moderate aortic valve stenosis. Aortic  regurgitation PHT measures 740 msec. Aortic   valve area, by VTI measures 1.00 cm. Aortic valve mean gradient measures  24.0 mmHg. Aortic valve Vmax measures 3.26 m/s.   6. The inferior vena cava is normal in size with greater than 50%  respiratory variability, suggesting right atrial pressure of 3 mmHg.   Comparison(s): No significant change from prior study.   FINDINGS   Left Ventricle: Left ventricular ejection fraction, by estimation, is 60  to 65%. The left ventricle has normal function. The left ventricle has no  regional wall motion abnormalities. The average left ventricular global  longitudinal strain is -22.6 %.  The global longitudinal strain is normal. The left ventricular internal  cavity size was normal in size. There is no left ventricular hypertrophy.  Left ventricular diastolic parameters are indeterminate.   Right Ventricle: The right ventricular size is normal. Right vetricular  wall thickness was not  well visualized. Right ventricular systolic  function is normal. There is normal pulmonary artery systolic pressure.  The tricuspid regurgitant velocity is 1.90  m/s, and with an assumed right atrial pressure of 3 mmHg, the estimated  right ventricular systolic pressure is 17.4 mmHg.   Left Atrium: Left atrial size was mildly dilated.   Right Atrium: Right atrial size was normal in size.   Pericardium: There is no evidence of pericardial effusion.   Mitral Valve: The mitral valve is degenerative in appearance. Moderate to  severe mitral annular calcification. Trivial mitral valve regurgitation.  No evidence of mitral valve stenosis. MV peak gradient, 6.2 mmHg. The mean  mitral valve gradient is 2.0  mmHg.   Tricuspid Valve: The tricuspid valve is normal in structure. Tricuspid  valve regurgitation is trivial. No evidence of tricuspid stenosis.   Aortic Valve: The aortic valve is calcified. There is severe calcifcation  of the aortic valve. There is severe thickening of the aortic valve.  Aortic valve regurgitation is trivial. Aortic regurgitation PHT measures  740 msec. Moderate aortic stenosis is  present. Aortic valve mean gradient measures 24.0 mmHg. Aortic valve peak  gradient measures 42.5 mmHg. Aortic valve area, by VTI measures 1.00 cm.   Pulmonic Valve: The pulmonic valve was not well visualized. Pulmonic valve  regurgitation is trivial. No evidence of pulmonic stenosis.   Aorta: The aortic root, ascending aorta, aortic arch and descending aorta  are all structurally normal, with no evidence of dilitation or  obstruction.   Venous: The inferior vena  cava is normal in size with greater than 50%  respiratory variability, suggesting right atrial pressure of 3 mmHg.   IAS/Shunts: The atrial septum is grossly normal.     LEFT VENTRICLE  PLAX 2D  LVIDd:         4.50 cm   Diastology  LVIDs:         3.10 cm   LV e' medial:    5.44 cm/s  LV PW:         0.90 cm   LV E/e'  medial:  12.7  LV IVS:        0.80 cm   LV e' lateral:   6.09 cm/s  LVOT diam:     2.10 cm   LV E/e' lateral: 11.4  LV SV:         78  LV SV Index:   37        2D Longitudinal Strain  LVOT Area:     3.46 cm  2D Strain GLS (A2C):   -22.7 %                           2D Strain GLS (A3C):   -22.2 %                           2D Strain GLS (A4C):   -22.8 %                           2D Strain GLS Avg:     -22.6 %                             3D Volume EF:                           3D EF:        72 %                           LV EDV:       147 ml                           LV ESV:       41 ml                           LV SV:        106 ml   RIGHT VENTRICLE  RV Basal diam:  3.00 cm  RV S prime:     15.70 cm/s  TAPSE (M-mode): 2.8 cm  RVSP:           17.4 mmHg   LEFT ATRIUM             Index        RIGHT ATRIUM           Index  LA diam:        3.75 cm 1.79 cm/m   RA Pressure: 3.00 mmHg  LA Vol (A2C):   68.9 ml 32.93 ml/m  RA Area:     13.20 cm  LA Vol (A4C):   52.5 ml 25.09 ml/m  RA Volume:   33.70 ml  16.11 ml/m  LA Biplane Vol: 66.1  ml 31.59 ml/m   AORTIC VALVE  AV Area (Vmax):    1.02 cm  AV Area (Vmean):   0.99 cm  AV Area (VTI):     1.00 cm  AV Vmax:           326.00 cm/s  AV Vmean:          227.333 cm/s  AV VTI:            0.781 m  AV Peak Grad:      42.5 mmHg  AV Mean Grad:      24.0 mmHg  LVOT Vmax:         95.85 cm/s  LVOT Vmean:        65.125 cm/s  LVOT VTI:          0.225 m  LVOT/AV VTI ratio: 0.29  AI PHT:            740 msec    AORTA  Ao Root diam: 3.80 cm  Ao Asc diam:  3.60 cm   MITRAL VALVE                TRICUSPID VALVE  MV Area (PHT)  cm          TR Peak grad:   14.4 mmHg  MV Area VTI:   1.68 cm     TR Vmax:        190.00 cm/s  MV Peak grad:  6.2 mmHg     Estimated RAP:  3.00 mmHg  MV Mean grad:  2.0 mmHg     RVSP:           17.4 mmHg  MV Vmax:       1.24 m/s  MV Vmean:      56.7 cm/s    SHUNTS  MV Decel Time: 291 msec     Systemic VTI:  0.22 m   MV E velocity: 69.17 cm/s   Systemic Diam: 2.10 cm  MV A velocity: 108.00 cm/s  MV E/A ratio:  0.64    Cardiac CT 01/02/23: Valve Morphology: Tricuspid aortic valve with moderate diffuse calcifications. Leaflets are moderately restricted in systole. AVA 1.44 cm2   Aortic Valve Calcium score: 829   Aortic annular dimension:   Phase assessed: 15%   Annular area: 363 mm2   Annular perimeter: 68.6 mm   Max diameter: 23.5 mm   Min diameter: 20.3 mm   Annular and subannular calcification: None.   Membranous septum length: 6.1 mm   Optimal coplanar projection: LAO 5 CAU 4   Coronary Artery Height above Annulus:   Left Main: 5.5 mm   Right Coronary: 15.5 mm   Sinus of Valsalva Measurements:   Non-coronary: 34 mm   Right-coronary: 34 mm   Left-coronary: 34 mm   Sinus of Valsalva Height:   Non-coronary: 25.8 mm   Right-coronary: 15.5 mm   Left-coronary: 18.0 mm   Sinotubular Junction: 27 mm   Ascending Thoracic Aorta: 33 mm   Coronary Arteries: Normal coronary origin. Right dominance. The study was performed without use of NTG and is insufficient for plaque evaluation. Please refer to recent cardiac catheterization for coronary assessment.   Cardiac Morphology:   Right Atrium: Right atrial size is within normal limits.   Right Ventricle: The right ventricular cavity is within normal limits.   Left Atrium: Left atrial size is normal in size with no left atrial appendage filling defect.   Left Ventricle: The ventricular cavity size is within normal limits.  Inferoapical akinesis noted.   Pulmonary arteries: Normal in size without proximal filling defect.   Pulmonary veins: Normal pulmonary venous drainage.   Pericardium: Normal thickness with no significant effusion or calcium present.   Mitral Valve: The mitral valve is normal structure without significant calcification.   Extra-cardiac findings: See attached radiology report for non-cardiac  structures.   IMPRESSION: 1. Moderate aortic valve calcifications. Aortic valve calcium score 829.   2. Annular measurements support a 23 mm S3 TAVR or 26 mm Evolut Pro.   3. No significant annular or subannular calcifications.   4. Shallow LCA height at 5.5 mm. Large sinuses noted. Structural heart team discussion recommended.   5. Optimal Fluoroscopic Angle for Delivery: LAO 5 CAU 4   6. Inferoapical akinesis noted.   CTA CHEST FINDINGS   Cardiovascular: Normal heart size. No pericardial effusion. Aortic valve thickening and calcifications. Normal caliber thoracic aorta with moderate atherosclerotic disease. Standard three-vessel aortic arch. Severe narrowing of the left subclavian artery secondary to calcified plaque. Severe three-vessel coronary artery calcifications.   Mediastinum/Nodes: Esophagus and thyroid are unremarkable. No enlarged lymph nodes seen in the chest.   Lungs/Pleura: Central airways are patent. 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 No consolidation, pleural effusion or pneumothorax.   Musculoskeletal: No chest wall abnormality. No acute or significant osseous findings.   CTA ABDOMEN AND PELVIS FINDINGS   Hepatobiliary: No focal liver abnormality is seen. No gallstones, gallbladder wall thickening, or biliary dilatation.   Pancreas: Cystic lesion of the uncinate process of the pancreas measuring 11 mm on series 6, image 146. No main pancreatic duct dilation.   Spleen: Normal in size without focal abnormality.   Adrenals/Urinary Tract: Indeterminate right adrenal gland nodule measuring 2.8 cm. Left adrenal gland is unremarkable. No hydronephrosis or nephrolithiasis. Bladder is unremarkable.   Stomach/Bowel: Stomach is within normal limits. Appendix appears normal. Diverticulosis. No evidence of bowel wall thickening, distention, or inflammatory changes.   Vascular/lymphatic: Focal  dilation of the infrarenal abdominal aorta measuring up to 2.0 cm, not technically aneurysmal. Moderate atherosclerotic disease of the abdominal aorta. Moderate narrowing of the right renal artery due to calcified plaque, otherwise no significant stenosis. No enlarged lymph nodes seen in the abdomen or pelvis.   Reproductive: Status post hysterectomy. No adnexal masses.   Other: Moderate fat containing ventral abdominal hernia. No abdominopelvic ascites.   Musculoskeletal: No acute or significant osseous findings.   VASCULAR MEASUREMENTS PERTINENT TO TAVR:   AORTA:   Minimal Aortic Diameter-12.5 mm   Severity of Aortic Calcification-moderate   RIGHT PELVIS:   Right Common Iliac Artery -   Minimal Diameter-4.6 mm   Tortuosity-severe   Calcification-moderate   Right External Iliac Artery -   Minimal Diameter-6.3 mm   Tortuosity-mild   Calcification-none   Right Common Femoral Artery -   Minimal Diameter-7.4 mm   Tortuosity-none   Calcification-mild   LEFT PELVIS:   Left Common Iliac Artery -   Minimal Diameter-8.0 mm   Tortuosity-mild   Calcification-moderate   Left External Iliac Artery -   Minimal Diameter-7.0 mm   Tortuosity-mild   Calcification-none   Left Common Femoral Artery -   Minimal Diameter-5.6 mm   Tortuosity-mild   Calcification-mild   Review of the MIP images confirms the above findings.   IMPRESSION: Vascular:   1. Vascular findings and measurements pertinent to potential TAVR procedure, as detailed above. 2. Thickening and calcification of the aortic valve, compatible  with reported clinical history of aortic stenosis. 3. Moderate aortoiliac atherosclerosis. Severe three-vessel coronary artery calcifications.   Nonvascular:   1. Indeterminate right adrenal gland nodule measuring 2.8 cm. Recommend adrenal protocol CT for further evaluation. 2. 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 Recommend follow up pre and post contrast MRI/MRCP or pancreatic protocol CT in 2 years.  Cardiac cath 12/26/22:    Prox RCA to Mid RCA lesion is 100% stenosed.   3rd LPL lesion is 70% stenosed.   LPAV lesion is 90% stenosed.   Prox LAD to Mid LAD lesion is 50% stenosed.   1st Diag lesion is 50% stenosed.   The LAD is a large caliber vessel that courses to the apex. The stented segment of the mid LAD at the takeoff of the Diagonal branch is patent. The bifurcation stenting of the LAD and Diagonal is patent. There is moderate restenosis in the LAD and Diagonal stents but this does not appear to be flow limiting.    The Circumflex is a large dominant vessel. The proximal and mid Circumflex is patent with mild plaque disease. The stented segment of the distal AV groove Circumflex leading into the most distal obtuse marginal branch has severe stent restenosis throughout. There is also diffuse disease in the small caliber obtuse marginal branch beyond the stent. This is not favorable for PCI.    The RCA is a moderate caliber non-dominant vessel with chronic total occlusion of the proximal vessel. The mid and distal RCA fills from left to right collaterals.    She is known to have paradoxical low flow/low gradient severe aortic stenosis by echo. Peak to peak gradient on cath today 10 mmHg.    Recommendations: Medical management of CAD. Smoking cessation. Continue TAVR workup.    Diagnostic Dominance: Co-dominant Left Anterior Descending  Prox LAD to Mid LAD lesion is 50% stenosed. The lesion was previously treated using a bare metal stent over 2 years ago.    First Diagonal Branch  1st Diag lesion is 50% stenosed. The lesion was previously treated using a bare metal stent over 2 years ago.    Left Circumflex    Third Left Posterolateral Branch  3rd LPL lesion is 70% stenosed.    Left Posterior Atrioventricular Artery  LPAV lesion is 90% stenosed. The  lesion was previously treated using a stent (unknown type) over 2 years ago.    Right Coronary Artery  Vessel is moderate in size.  Prox RCA to Mid RCA lesion is 100% stenosed. The lesion is chronically occluded.    Right Posterior Descending Artery  Collaterals  RPDA filled by collaterals from 2nd Sept.      Intervention   No interventions have been documented.   Coronary Diagrams  Diagnostic Dominance: Co-dominant  Intervention   Implants   No implant documentation for this case.   Syngo Images   Show images for CARDIAC CATHETERIZATION Images on Long Term Storage   Show images for Hibbitts, Malley F Link to Procedure Log  Procedure Log    Hemo Data  Flowsheet Row Most Recent Value  Fick Cardiac Output 7.85 L/min  Fick Cardiac Output Index 3.82 (L/min)/BSA  Aortic Mean Gradient 0 mmHg  Aortic Peak Gradient 0 mmHg  Aortic Valve Area 1.64  Aortic Value Area Index 0.8 cm2/BSA  RA A Wave 12 mmHg  RA V Wave 8 mmHg  RA Mean 4 mmHg  RV Systolic Pressure 36  mmHg  RV Diastolic Pressure 2 mmHg  RV EDP 10 mmHg  PA Systolic Pressure 36 mmHg  PA Diastolic Pressure 8 mmHg  PA Mean 21 mmHg  PW A Wave 15 mmHg  PW V Wave 13 mmHg  PW Mean 7 mmHg  AO Systolic Pressure 140 mmHg  AO Diastolic Pressure 64 mmHg  AO Mean 91 mmHg  LV Systolic Pressure 159 mmHg  LV Diastolic Pressure 9 mmHg  LV EDP 13 mmHg  AOp Systolic Pressure 146 mmHg  AOp Diastolic Pressure 68 mmHg  AOp Mean Pressure 97 mmHg  LVp Systolic Pressure 166 mmHg  LVp Diastolic Pressure 92 mmHg  LVp EDP Pressure 22 mmHg  QP/QS 1  TPVR Index 5.5 HRUI  TSVR Index 22.79 HRUI  PVR SVR Ratio 0.17  TPVR/TSVR Ratio 0.24    Recent Labs: 12/22/2022: BUN 23; Creatinine, Ser 1.22; Platelets 264 12/26/2022: Hemoglobin 10.9; Potassium 3.9; Sodium 140   Lipid Panel No results found for: "CHOL", "TRIG", "HDL", "CHOLHDL", "VLDL", "LDLCALC", "LDLDIRECT"   Wt Readings from Last 3 Encounters:  05/28/23 101.4 kg   02/12/23 103.5 kg  01/12/23 102.7 kg    Assessment and Plan:  1. Moderately Severe Aortic Valve Stenosis: Based on findings on her cardiac CT and her most recent echo, her AS is moderate. She has NYHA class 2 symptoms. She is at high risk for a pacemaker when we elect to proceed with TAVR given risk for high grade heart block following TAVR (baseline RBBB, LAFB and Mobitz 1 AV block). Her left main height is low and there is risk of occlusion of the left main with TAVR although her sinuses seem to be big enough that this may not be an issue.   I have reviewed the natural history of aortic stenosis with the patient and their family members  who are present today. We have discussed the limitations of medical therapy and the poor prognosis associated with symptomatic aortic stenosis. We have reviewed potential treatment options, including palliative medical therapy, conventional surgical aortic valve replacement, and transcatheter aortic valve replacement. We discussed treatment options in the context of the patient's specific comorbid medical conditions.   At this time, we will continue to follow her AS. I will repeat her echo in 6 months. She will call back if she has worsening of her symptoms.     Labs/ tests ordered today include:   Orders Placed This Encounter  Procedures   ECHOCARDIOGRAM COMPLETE   Disposition:   F/U with me in 6 months.   Signed, Verne Carrow, MD, [facility] 05/28/2023 1:44 PM    [facility] [address] [place], [place], [place]  [address] Phone: [phone]; Fax: [phone]

## 2023-05-28 ENCOUNTER — Ambulatory Visit: Payer: 59 | Attending: Cardiovascular Disease | Admitting: Cardiovascular Disease

## 2023-05-28 ENCOUNTER — Encounter: Payer: Self-pay | Admitting: Cardiovascular Disease

## 2023-05-28 VITALS — BP 136/80 | HR 75 | Ht 65.0 in | Wt 223.6 lb

## 2023-05-28 DIAGNOSIS — I350 Nonrheumatic aortic (valve) stenosis: Secondary | ICD-10-CM | POA: Diagnosis not present

## 2023-05-28 MED ORDER — NITROGLYCERIN 0.4 MG SL SUBL: 0.4000 mg | SUBLINGUAL_TABLET | SUBLINGUAL | 3 refills | Status: AC | PRN

## 2023-05-28 NOTE — Patient Instructions (Signed)
Medication Instructions:  Your physician recommends that you continue on your current medications as directed. Please refer to the Current Medication list given to you today.  *If you need a refill on your cardiac medications before your next appointment, please call your pharmacy*  Testing/Procedures: Your physician has requested that you have an echocardiogram. Echocardiography is a painless test that uses sound waves to create images of your heart. It provides your doctor with information about the size and shape of your heart and how well your heart's chambers and valves are working. This procedure takes approximately one hour. There are no restrictions for this procedure. Please do NOT wear cologne, perfume, aftershave, or lotions (deodorant is allowed). Please arrive 15 minutes prior to your appointment time.   Follow-Up: At [facility], you and your health needs are our priority.  As part of our continuing mission to provide you with exceptional heart care, we have created designated Provider Care Teams.  These Care Teams include your primary Cardiologist (physician) and Advanced Practice Providers (APPs -  Physician Assistants and Nurse Practitioners) who all work together to provide you with the care you need, when you need it.   Your next appointment:   6 month(s)  Provider:   Dr Clifton James

## 2023-09-16 ENCOUNTER — Other Ambulatory Visit (HOSPITAL_BASED_OUTPATIENT_CLINIC_OR_DEPARTMENT_OTHER): Payer: Self-pay | Admitting: Family

## 2023-09-16 DIAGNOSIS — I10 Essential (primary) hypertension: Secondary | ICD-10-CM

## 2023-10-05 ENCOUNTER — Encounter (HOSPITAL_BASED_OUTPATIENT_CLINIC_OR_DEPARTMENT_OTHER): Payer: Self-pay

## 2023-10-06 NOTE — Telephone Encounter (Signed)
 Patient identification verified by 2 forms. Bertina Cooks, RN    Called and spoke to patient  Patient states:   -PCP is off/out this week   -PCP office is unable to schedule appointment   -she needs prescription for respiratory infection   -has some SOB due to infection  -symptoms: has a cough  -fever recently resolved, was treated for sinus infection by PCP office   -Yesterday paramedics gave her duo-neb and symptoms improved  -she declined presenting to ED yesterday with paramedics  Advised patient to outreach PCP office or present to urgent care today for evaluation  Patient agrees, states will present to urgent care

## 2023-11-12 ENCOUNTER — Ambulatory Visit (HOSPITAL_COMMUNITY): Payer: 59 | Attending: Cardiovascular Disease

## 2023-11-12 DIAGNOSIS — I350 Nonrheumatic aortic (valve) stenosis: Secondary | ICD-10-CM | POA: Insufficient documentation

## 2023-11-12 LAB — ECHOCARDIOGRAM COMPLETE
AR max vel: 0.82 cm2
AV Area VTI: 0.8 cm2
AV Area mean vel: 0.8 cm2
AV Mean grad: 26.8 mm[Hg]
AV Peak grad: 46.9 mm[Hg]
Ao pk vel: 3.42 m/s
Area-P 1/2: 2.87 cm2
S' Lateral: 2.1 cm

## 2023-11-15 NOTE — Progress Notes (Unsigned)
 Structural Heart Clinic Note  No chief complaint on file.  History of Present Illness: 78 yo female with history of CAD, hyperlipidemia, HTN, ongoing tobacco abuse and aortic stenosis who is here today for follow up. I saw her as a new consult in the structural heart clinic 12/23/22 for further discussion regarding her aortic stenosis and possible TAVR. She had an MI in 1997 and had complex stenting of the LAD and Diagonal branch. She had a stent placed in the left PDA in 2001 and balloon angioplasty of the non-dominant RCA at that time. Cardiac cath in 2007 with patency of the LAD and Circumflex stents. She has been followed for moderate aortic stenosis. She has had dizziness over the past few years which has improved some on Allegra and meclizine however her dizziness with standing persists. Cardiac monitor in January 2024 with Sinus, PACs, PVCs, short runs of SVT, 3.2 second pause with 2:1 AV block. Echo 11/18/22 with LVEF=60-65%. Mild AI. Moderately severe to severe paradoxical low flow/low gradient aortic stenosis (mean gradient 24 mmHg, AVA 0.83 cm2, SVI 31, DI 0.27). Visually her valve appears to have severe stenosis. We proceeded with planning for TAVR. Cardiac cath 12/26/22 with patent LAD/Diagonal stents with moderate restenosis, patent distal Circumflex stented segment with severe restenosis-not favorable for PCI. The non-dominant RCA is chronically occluded proximally and fills from left to right collaterals. Cardiac CTA with AV calcium score of 829. Annular area of 363 mm2. Low left main height at 5.5 mm. Her aortic stenosis is felt to be moderate based on the valve appearance on her the CT with AVA of 1.44 cm2. The annular area is suitable for a 23 mm Edwards valve or 26 mm Medtronic valve. She appears to have femoral access for TAVR. Her case was reviewed at the structural team meeting and her AS was felt to be moderate in early 2024. Echo 05/26/23 with LVEF=60-65%. Moderate aortic stenosis with  mean gradient 24 mmHg, AVA 1.0 cm2, SVI 37, DI 0.29. Echo 11/12/23 with LVEF=55-60%. Moderate AS with mean gradient 26.8 mmHg, underestimated AVA.   She is here today for follow up. The patient denies any chest pain, dyspnea, palpitations, lower extremity edema, orthopnea, PND, dizziness, near syncope or syncope. She continues to have baseline dyspnea and continues to smoke cigarettes.   Of note, CTA with right adrenal mass. 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   She lives with her son in [place], [place]. She is retired. She owned a Science writer. She goes to the dentist regularly. She does not have dentures or any active dental issues.   Primary Care Physician: Vivien Presto, MD Primary Cardiologist: Dominga Ferry Referring Cardiologist: Dominga Ferry  Past Medical History:  Diagnosis Date   Bilateral carotid bruits    Coronary artery disease with history of myocardial infarction without history of CABG    h/o MI in 1997, s/p PCI x3 stents in 1997, 2 stents in 2000   Essential hypertension    GAD (generalized anxiety disorder)    GERD (gastroesophageal reflux disease)    Mixed hyperlipidemia    Myocardial infarction (HCC) 1997   Nonrheumatic aortic (valve) stenosis    S/P coronary artery stent placement     Past Surgical History:  Procedure Laterality Date   ABDOMINAL HYSTERECTOMY  1988   LAPAROSCOPIC GASTROTOMY W/ REPAIR OF ULCER  1992   LAPAROSCOPIC OOPHERECTOMY     PERCUTANEOUS CORONARY STENT INTERVENTION (PCI-S)  h/o MI in 1997, s/p PCI x3 stents in 1997, 2 stents in 2000   RIGHT/LEFT HEART CATH AND CORONARY ANGIOGRAPHY N/A 12/26/2022   Procedure: RIGHT/LEFT HEART CATH AND CORONARY ANGIOGRAPHY;  Surgeon: Kathleene Hazel, MD;  Location: [facility] INVASIVE CV LAB;  Service: Cardiovascular;  Laterality: N/A;    Current Outpatient Medications  Medication Sig Dispense Refill   albuterol (VENTOLIN HFA) 108 (90 Base) MCG/ACT  inhaler Inhale 1 puff into the lungs every 6 (six) hours as needed for wheezing or shortness of breath.     amLODipine (NORVASC) 5 MG tablet TAKE ONE TABLET BY MOUTH EVERY DAY 300 tablet 0   aspirin EC 81 MG tablet Take 81 mg by mouth at bedtime. Swallow whole.     atorvastatin (LIPITOR) 80 MG tablet Take 80 mg by mouth at bedtime.     benzonatate (TESSALON) 100 MG capsule Take 100 mg by mouth 3 (three) times daily as needed for cough.     Cholecalciferol (VITAMIN D3) 125 MCG (5000 UT) TABS Take 15,000 Units by mouth daily.     clindamycin (CLINDAGEL) 1 % gel Apply topically 2 (two) times daily as needed.     famotidine (PEPCID) 40 MG tablet Take 1 tablet (40 mg total) by mouth at bedtime. 30 tablet 0   fexofenadine (ALLEGRA) 180 MG tablet Take 1 tablet (180 mg total) by mouth daily. 100 tablet 1   fexofenadine-pseudoephedrine (ALLEGRA-D 24) 180-240 MG 24 hr tablet Take 1 tablet by mouth at bedtime.     furosemide (LASIX) 20 MG tablet Take 20 mg by mouth daily as needed for fluid or edema.     ibuprofen (ADVIL) 200 MG tablet Take 200 mg by mouth every 6 (six) hours as needed for mild pain or moderate pain (Arthritis).     LORazepam (ATIVAN) 0.5 MG tablet Take 0.5 mg by mouth at bedtime.     meclizine (ANTIVERT) 25 MG tablet Take 1 tablet (25 mg total) by mouth 3 (three) times daily as needed for dizziness. 90 tablet 0   nitroGLYCERIN (NITROSTAT) 0.4 MG SL tablet Place 1 tablet (0.4 mg total) under the tongue every 5 (five) minutes as needed for chest pain. 25 tablet 3   omeprazole (PRILOSEC) 40 MG capsule Take 30- 60 min before your first and last meals of the day     No current facility-administered medications for this visit.    Allergies  Allergen Reactions   Erythromycin Other (See Comments)    Jaundice   Levocetirizine Shortness Of Breath    "choking, can't breathe"   Rosuvastatin Nausea And Vomiting    Other reaction(s): Abdominal Pain, Bleeding, Constipation, Cough, Dizziness    Fosamax [Alendronate] Other (See Comments)    Muscle spasm     Social History   Socioeconomic History   Marital status: Divorced    Spouse name: Not on file   Number of children: 1   Years of education: Not on file   Highest education level: Not on file  Occupational History   Occupation: Retired-Ran a Science writer  Tobacco Use   Smoking status: Every Day    Current packs/day: 1.00    Average packs/day: 1 pack/day for 60.7 years (60.7 ttl pk-yrs)    Types: Cigarettes    Start date: 03/19/1963    Passive exposure: Never   Smokeless tobacco: Never   Tobacco comments:    smokes 5-10 cigarettes per day [facility] 12/31/21  Vaping Use   Vaping status: Never Used  Substance and Sexual  Activity   Alcohol use: Not on file   Drug use: Not on file   Sexual activity: Not on file  Other Topics Concern   Not on file  Social History Narrative   Not on file   Social Drivers of Health   Financial Resource Strain: Medium Risk (10/06/2023)   Received from [facility]   Overall Financial Resource Strain (CARDIA)    Difficulty of Paying Living Expenses: Somewhat hard  Food Insecurity: Food Insecurity Present (10/06/2023)   [facility]   Hunger Vital Sign    Worried About Running Out of Food in the Last Year: Sometimes true    Ran Out of Food in the Last Year: Sometimes true  Transportation Needs: No Transportation Needs (10/06/2023)   Received from [facility] - Transportation    Lack of Transportation (Medical): No    Lack of Transportation (Non-Medical): No  Physical Activity: Insufficiently Active (10/06/2023)   Received from [facility]   Exercise Vital Sign    Days of Exercise per Week: 3 days    Minutes of Exercise per Session: 10 min  Stress: Stress Concern Present (10/06/2023)   Received from [facility] - Occupational Stress Questionnaire    Feeling of Stress : To some extent  Social Connections: Moderately  Integrated (10/06/2023)   Received from [facility]   Social Network    How would you rate your social network (family, work, friends)?: Adequate participation with social networks  Intimate Partner Violence: Not At Risk (10/06/2023)   Received from [facility]   HITS    Over the last 12 months how often did your partner physically hurt you?: Never    Over the last 12 months how often did your partner insult you or talk down to you?: Never    Over the last 12 months how often did your partner threaten you with physical harm?: Never    Over the last 12 months how often did your partner scream or curse at you?: Never    Family History  Problem Relation Age of Onset   Depression Mother    Diabetes Mother    Heart failure Mother    Heart attack Mother 3   Breast cancer Mother    Heart attack Father 42   Heart attack Sister    Diabetes Brother    Depression Brother    Heart attack Brother 50   Heart attack Daughter     Review of Systems:  As stated in the HPI and otherwise negative.   There were no vitals taken for this visit.  Physical Examination:  General: Well developed, well nourished, NAD  HEENT: OP clear, mucus membranes moist  SKIN: warm, dry. No rashes. Neuro: No focal deficits  Musculoskeletal: Muscle strength 5/5 all ext  Psychiatric: Mood and affect normal  Neck: No JVD, no carotid bruits, no thyromegaly, no lymphadenopathy.  Lungs:Clear bilaterally, no wheezes, rhonci, crackles Cardiovascular: Regular rate and rhythm. No murmurs, gallops or rubs. Abdomen:Soft. Bowel sounds present. Non-tender.  Extremities: No lower extremity edema. Pulses are 2 + in the bilateral DP/PT.  EKG:  EKG is *** ordered today. The ekg ordered today demonstrates    Echo 11/12/23:  1. Left ventricular ejection fraction, by estimation, is 55 to 60%. Left  ventricular ejection fraction by 3D volume is 56 %. The left ventricle has  normal function. The left ventricle has no regional  wall motion  abnormalities. Left  ventricular diastolic   parameters are consistent with Grade I diastolic dysfunction (impaired  relaxation). The average left ventricular global longitudinal strain is  -25.2 %. The global longitudinal strain is normal.   2. Right ventricular systolic function is normal. The right ventricular  size is normal. Tricuspid regurgitation signal is inadequate for assessing  PA pressure.   3. The mitral valve is degenerative. Trivial mitral valve regurgitation.  No evidence of mitral stenosis. Moderate mitral annular calcification.   4. The aortic valve is tricuspid. There is severe calcifcation of the  aortic valve. There is moderate thickening of the aortic valve. Aortic  valve regurgitation is trivial. Moderate aortic valve stenosis. Aortic  valve area, by VTI measures 0.80 cm.  Aortic valve mean gradient measures 26.8 mmHg. Aortic valve Vmax measures  3.42 m/s. The AVA is underestimated due to small LVOT diameter  measurement.   5. The inferior vena cava is normal in size with greater than 50%  respiratory variability, suggesting right atrial pressure of 3 mmHg.   6. The aortic root measurements are within normal limits for age when  indexed to body surface area.   7. Compated to prior echo dated 05/26/2023, there is no significant  change.   FINDINGS   Left Ventricle: Left ventricular ejection fraction, by estimation, is 55  to 60%. Left ventricular ejection fraction by 3D volume is 56 %. The left  ventricle has normal function. The left ventricle has no regional wall  motion abnormalities. The average  left ventricular global longitudinal strain is -25.2 %. Strain was  performed and the global longitudinal strain is normal. The left  ventricular internal cavity size was normal in size. There is no left  ventricular hypertrophy. Left ventricular diastolic  parameters are consistent with Grade I diastolic dysfunction (impaired  relaxation). Normal left  ventricular filling pressure.   Right Ventricle: The right ventricular size is normal. No increase in  right ventricular wall thickness. Right ventricular systolic function is  normal. Tricuspid regurgitation signal is inadequate for assessing PA  pressure.   Left Atrium: Left atrial size was normal in size.   Right Atrium: Right atrial size was normal in size.   Pericardium: There is no evidence of pericardial effusion.   Mitral Valve: The mitral valve is degenerative in appearance. There is  mild thickening of the mitral valve leaflet(s). There is mild  calcification of the mitral valve leaflet(s). Moderate mitral annular  calcification. Trivial mitral valve regurgitation.  No evidence of mitral valve stenosis.   Tricuspid Valve: The tricuspid valve is normal in structure. Tricuspid  valve regurgitation is trivial. No evidence of tricuspid stenosis.   Aortic Valve: The aortic valve is tricuspid. There is severe calcifcation  of the aortic valve. There is moderate thickening of the aortic valve.  Aortic valve regurgitation is trivial. Moderate aortic stenosis is  present. Aortic valve mean gradient  measures 26.8 mmHg. Aortic valve peak gradient measures 46.9 mmHg. Aortic  valve area, by VTI measures 0.80 cm.    Pulmonic Valve: The pulmonic valve was normal in structure. Pulmonic valve  regurgitation is not visualized. No evidence of pulmonic stenosis.   Aorta: The aortic root is normal in size and structure. Ascending aorta  measurements are within normal limits for age when indexed to body surface  area.   Venous: The inferior vena cava is normal in size with greater than 50%  respiratory variability, suggesting right atrial pressure of 3 mmHg.   IAS/Shunts: No atrial level shunt detected  by color flow Doppler.   Additional Comments: 3D was performed not requiring image post processing  on an independent workstation and was normal.     LEFT VENTRICLE  PLAX 2D   LVIDd:         4.60 cm         Diastology  LVIDs:         2.10 cm         LV e' medial:    7.18 cm/s  LV PW:         0.90 cm         LV E/e' medial:  10.6  LV IVS:        0.90 cm         LV e' lateral:   7.80 cm/s  LVOT diam:     1.80 cm         LV E/e' lateral: 9.8  LV SV:         63  LV SV Index:   30              2D  LVOT Area:     2.54 cm        Longitudinal                                 Strain                                 2D Strain GLS  -26.1 %                                 (A2C):                                 2D Strain GLS  -25.0 %                                 (A3C):                                 2D Strain GLS  -24.3 %                                 (A4C):                                 2D Strain GLS  -25.2 %                                 Avg:                                   3D Volume EF                                 LV 3D EF:    Left  ventricul                                              ar                                              ejection                                              fraction                                              by 3D                                              volume is                                              56 %.                                   3D Volume EF:                                 3D EF:        56 %                                 LV EDV:       178 ml                                 LV ESV:       79 ml                                 LV SV:        99 ml   RIGHT VENTRICLE  RV Basal diam:  3.70 cm  RV S prime:     17.10 cm/s  TAPSE (M-mode): 2.9 cm   LEFT ATRIUM             Index        RIGHT ATRIUM           Index  LA diam:        4.10 cm 1.98 cm/m   RA Area:     12.10 cm  LA Vol (A2C):   51.2 ml 24.68 ml/m  RA Volume:   28.50 ml  13.74 ml/m  LA Vol (A4C):  37.0 ml 17.84 ml/m  LA Biplane Vol: 45.9 ml 22.13 ml/m   AORTIC VALVE  AV Area (Vmax):     0.82 cm  AV Area (Vmean):   0.80 cm  AV Area (VTI):     0.80 cm  AV Vmax:           342.40 cm/s  AV Vmean:          243.000 cm/s  AV VTI:            0.790 m  AV Peak Grad:      46.9 mmHg  AV Mean Grad:      26.8 mmHg  LVOT Vmax:         110.50 cm/s  LVOT Vmean:        76.867 cm/s  LVOT VTI:          0.248 m  LVOT/AV VTI ratio: 0.31    AORTA  Ao Root diam: 3.90 cm  Ao Asc diam:  3.30 cm   MITRAL VALVE  MV Area (PHT): 2.87 cm     SHUNTS  MV Decel Time: 264 msec     Systemic VTI:  0.25 m  MV E velocity: 76.25 cm/s   Systemic Diam: 1.80 cm  MV A velocity: 120.50 cm/s  MV E/A ratio:  0.63   Cardiac CT 01/02/23: Valve Morphology: Tricuspid aortic valve with moderate diffuse calcifications. Leaflets are moderately restricted in systole. AVA 1.44 cm2   Aortic Valve Calcium score: 829   Aortic annular dimension:   Phase assessed: 15%   Annular area: 363 mm2   Annular perimeter: 68.6 mm   Max diameter: 23.5 mm   Min diameter: 20.3 mm   Annular and subannular calcification: None.   Membranous septum length: 6.1 mm   Optimal coplanar projection: LAO 5 CAU 4   Coronary Artery Height above Annulus:   Left Main: 5.5 mm   Right Coronary: 15.5 mm   Sinus of Valsalva Measurements:   Non-coronary: 34 mm   Right-coronary: 34 mm   Left-coronary: 34 mm   Sinus of Valsalva Height:   Non-coronary: 25.8 mm   Right-coronary: 15.5 mm   Left-coronary: 18.0 mm   Sinotubular Junction: 27 mm   Ascending Thoracic Aorta: 33 mm   Coronary Arteries: Normal coronary origin. Right dominance. The study was performed without use of NTG and is insufficient for plaque evaluation. Please refer to recent cardiac catheterization for coronary assessment.   Cardiac Morphology:   Right Atrium: Right atrial size is within normal limits.   Right Ventricle: The right ventricular cavity is within normal limits.   Left Atrium: Left atrial size is normal in size with no left  atrial appendage filling defect.   Left Ventricle: The ventricular cavity size is within normal limits. Inferoapical akinesis noted.   Pulmonary arteries: Normal in size without proximal filling defect.   Pulmonary veins: Normal pulmonary venous drainage.   Pericardium: Normal thickness with no significant effusion or calcium present.   Mitral Valve: The mitral valve is normal structure without significant calcification.   Extra-cardiac findings: See attached radiology report for non-cardiac structures.   IMPRESSION: 1. Moderate aortic valve calcifications. Aortic valve calcium score 829.   2. Annular measurements support a 23 mm S3 TAVR or 26 mm Evolut Pro.   3. No significant annular or subannular calcifications.   4. Shallow LCA height at 5.5 mm. Large sinuses noted. Structural heart team discussion recommended.   5. Optimal Fluoroscopic Angle for Delivery: LAO 5  CAU 4   6. Inferoapical akinesis noted.   CTA CHEST FINDINGS   Cardiovascular: Normal heart size. No pericardial effusion. Aortic valve thickening and calcifications. Normal caliber thoracic aorta with moderate atherosclerotic disease. Standard three-vessel aortic arch. Severe narrowing of the left subclavian artery secondary to calcified plaque. Severe three-vessel coronary artery calcifications.   Mediastinum/Nodes: Esophagus and thyroid are unremarkable. No enlarged lymph nodes seen in the chest.   Lungs/Pleura: Central airways are patent. 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 No consolidation, pleural effusion or pneumothorax.   Musculoskeletal: No chest wall abnormality. No acute or significant osseous findings.   CTA ABDOMEN AND PELVIS FINDINGS   Hepatobiliary: No focal liver abnormality is seen. No gallstones, gallbladder wall thickening, or biliary dilatation.   Pancreas: Cystic lesion of the uncinate process of the  pancreas measuring 11 mm on series 6, image 146. No main pancreatic duct dilation.   Spleen: Normal in size without focal abnormality.   Adrenals/Urinary Tract: Indeterminate right adrenal gland nodule measuring 2.8 cm. Left adrenal gland is unremarkable. No hydronephrosis or nephrolithiasis. Bladder is unremarkable.   Stomach/Bowel: Stomach is within normal limits. Appendix appears normal. Diverticulosis. No evidence of bowel wall thickening, distention, or inflammatory changes.   Vascular/lymphatic: Focal dilation of the infrarenal abdominal aorta measuring up to 2.0 cm, not technically aneurysmal. Moderate atherosclerotic disease of the abdominal aorta. Moderate narrowing of the right renal artery due to calcified plaque, otherwise no significant stenosis. No enlarged lymph nodes seen in the abdomen or pelvis.   Reproductive: Status post hysterectomy. No adnexal masses.   Other: Moderate fat containing ventral abdominal hernia. No abdominopelvic ascites.   Musculoskeletal: No acute or significant osseous findings.   VASCULAR MEASUREMENTS PERTINENT TO TAVR:   AORTA:   Minimal Aortic Diameter-12.5 mm   Severity of Aortic Calcification-moderate   RIGHT PELVIS:   Right Common Iliac Artery -   Minimal Diameter-4.6 mm   Tortuosity-severe   Calcification-moderate   Right External Iliac Artery -   Minimal Diameter-6.3 mm   Tortuosity-mild   Calcification-none   Right Common Femoral Artery -   Minimal Diameter-7.4 mm   Tortuosity-none   Calcification-mild   LEFT PELVIS:   Left Common Iliac Artery -   Minimal Diameter-8.0 mm   Tortuosity-mild   Calcification-moderate   Left External Iliac Artery -   Minimal Diameter-7.0 mm   Tortuosity-mild   Calcification-none   Left Common Femoral Artery -   Minimal Diameter-5.6 mm   Tortuosity-mild   Calcification-mild   Review of the MIP images confirms the above findings.    IMPRESSION: Vascular:   1. Vascular findings and measurements pertinent to potential TAVR procedure, as detailed above. 2. Thickening and calcification of the aortic valve, compatible with reported clinical history of aortic stenosis. 3. Moderate aortoiliac atherosclerosis. Severe three-vessel coronary artery calcifications.   Nonvascular:   1. Indeterminate right adrenal gland nodule measuring 2.8 cm. Recommend adrenal protocol CT for further evaluation. 2. 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 Recommend follow up pre and post contrast MRI/MRCP or pancreatic protocol CT in 2 years.  Cardiac cath 12/26/22:    Prox RCA to Mid RCA lesion is 100% stenosed.   3rd LPL lesion is 70% stenosed.   LPAV lesion is 90% stenosed.   Prox LAD to Mid LAD lesion is 50% stenosed.   1st Diag lesion is 50% stenosed.  The LAD is a large caliber vessel that courses to the apex. The stented segment of the mid LAD at the takeoff of the Diagonal branch is patent. The bifurcation stenting of the LAD and Diagonal is patent. There is moderate restenosis in the LAD and Diagonal stents but this does not appear to be flow limiting.    The Circumflex is a large dominant vessel. The proximal and mid Circumflex is patent with mild plaque disease. The stented segment of the distal AV groove Circumflex leading into the most distal obtuse marginal branch has severe stent restenosis throughout. There is also diffuse disease in the small caliber obtuse marginal branch beyond the stent. This is not favorable for PCI.    The RCA is a moderate caliber non-dominant vessel with chronic total occlusion of the proximal vessel. The mid and distal RCA fills from left to right collaterals.    She is known to have paradoxical low flow/low gradient severe aortic stenosis by echo. Peak to peak gradient on cath today 10 mmHg.    Recommendations: Medical management of  CAD. Smoking cessation. Continue TAVR workup.    Diagnostic Dominance: Co-dominant Left Anterior Descending  Prox LAD to Mid LAD lesion is 50% stenosed. The lesion was previously treated using a bare metal stent over 2 years ago.    First Diagonal Branch  1st Diag lesion is 50% stenosed. The lesion was previously treated using a bare metal stent over 2 years ago.    Left Circumflex    Third Left Posterolateral Branch  3rd LPL lesion is 70% stenosed.    Left Posterior Atrioventricular Artery  LPAV lesion is 90% stenosed. The lesion was previously treated using a stent (unknown type) over 2 years ago.    Right Coronary Artery  Vessel is moderate in size.  Prox RCA to Mid RCA lesion is 100% stenosed. The lesion is chronically occluded.    Right Posterior Descending Artery  Collaterals  RPDA filled by collaterals from 2nd Sept.      Intervention   No interventions have been documented.   Coronary Diagrams  Diagnostic Dominance: Co-dominant  Intervention   Implants   No implant documentation for this case.   Syngo Images   Show images for CARDIAC CATHETERIZATION Images on Long [employer]   Show images for Shyann, Hefner to Procedure Log  Procedure Log    Hemo Data  Flowsheet Row Most Recent Value  Fick Cardiac Output 7.85 L/min  Fick Cardiac Output Index 3.82 (L/min)/BSA  Aortic Mean Gradient 0 mmHg  Aortic Peak Gradient 0 mmHg  Aortic Valve Area 1.64  Aortic Value Area Index 0.8 cm2/BSA  RA A Wave 12 mmHg  RA V Wave 8 mmHg  RA Mean 4 mmHg  RV Systolic Pressure 36 mmHg  RV Diastolic Pressure 2 mmHg  RV EDP 10 mmHg  PA Systolic Pressure 36 mmHg  PA Diastolic Pressure 8 mmHg  PA Mean 21 mmHg  PW A Wave 15 mmHg  PW V Wave 13 mmHg  PW Mean 7 mmHg  AO Systolic Pressure 140 mmHg  AO Diastolic Pressure 64 mmHg  AO Mean 91 mmHg  LV Systolic Pressure 159 mmHg  LV Diastolic Pressure 9 mmHg  LV EDP 13 mmHg  AOp Systolic Pressure 146 mmHg  AOp  Diastolic Pressure 68 mmHg  AOp Mean Pressure 97 mmHg  LVp Systolic Pressure 166 mmHg  LVp Diastolic Pressure 92 mmHg  LVp EDP Pressure 22 mmHg  QP/QS 1  TPVR Index 5.5  HRUI  TSVR Index 22.79 HRUI  PVR SVR Ratio 0.17  TPVR/TSVR Ratio 0.24    Recent Labs: 12/22/2022: BUN 23; Creatinine, Ser 1.22; Platelets 264 12/26/2022: Hemoglobin 10.9; Potassium 3.9; Sodium 140   Lipid Panel No results found for: "CHOL", "TRIG", "HDL", "CHOLHDL", "VLDL", "LDLCALC", "LDLDIRECT"   Wt Readings from Last 3 Encounters:  05/28/23 101.4 kg  02/12/23 103.5 kg  01/12/23 102.7 kg    Assessment and Plan:  *** plan from here *** 1. Moderately Severe Aortic Valve Stenosis: Based on findings on her cardiac CT and her most recent echo, her AS is moderate. She has NYHA class 2 symptoms. She is at high risk for a pacemaker when we elect to proceed with TAVR given risk for high grade heart block following TAVR (baseline RBBB, LAFB and Mobitz 1 AV block). Her left main height is low and there is risk of occlusion of the left main with TAVR although her sinuses seem to be big enough that this may not be an issue.   I have reviewed the natural history of aortic stenosis with the patient and their family members  who are present today. We have discussed the limitations of medical therapy and the poor prognosis associated with symptomatic aortic stenosis. We have reviewed potential treatment options, including palliative medical therapy, conventional surgical aortic valve replacement, and transcatheter aortic valve replacement. We discussed treatment options in the context of the patient's specific comorbid medical conditions.   At this time, we will continue to follow her AS. I will repeat her echo in 6 months. She will call back if she has worsening of her symptoms.     Labs/ tests ordered today include:   No orders of the defined types were placed in this encounter.  Disposition:   F/U with me in 6 months.    Signed, Verne Carrow, MD, [facility] 11/15/2023 6:27 PM    [facility] [address] [place], [place], [place]  [address] Phone: [phone]; Fax: [phone]

## 2023-11-16 ENCOUNTER — Ambulatory Visit: Payer: 59 | Attending: Cardiovascular Disease | Admitting: Cardiovascular Disease

## 2023-11-16 ENCOUNTER — Encounter: Payer: Self-pay | Admitting: Cardiovascular Disease

## 2023-11-16 VITALS — BP 130/84 | HR 89 | Ht 65.0 in | Wt 222.0 lb

## 2023-11-16 DIAGNOSIS — I350 Nonrheumatic aortic (valve) stenosis: Secondary | ICD-10-CM

## 2023-11-16 DIAGNOSIS — I10 Essential (primary) hypertension: Secondary | ICD-10-CM

## 2023-11-16 DIAGNOSIS — I2510 Atherosclerotic heart disease of native coronary artery without angina pectoris: Secondary | ICD-10-CM

## 2023-11-16 NOTE — Patient Instructions (Signed)
 Medication Instructions:  No changes *If you need a refill on your cardiac medications before your next appointment, please call your pharmacy*   Lab Work: none   Testing/Procedures: ECHO DUE IN AUGUST 2025 Your physician has requested that you have an echocardiogram. Echocardiography is a painless test that uses sound waves to create images of your heart. It provides your doctor with information about the size and shape of your heart and how well your heart's chambers and valves are working. This procedure takes approximately one hour. There are no restrictions for this procedure. Please do NOT wear cologne, perfume, aftershave, or lotions (deodorant is allowed). Please arrive 15 minutes prior to your appointment time.  Please note: We ask at that you not bring children with you during ultrasound (echo/ vascular) testing. Due to room size and safety concerns, children are not allowed in the ultrasound rooms during exams. Our front office staff cannot provide observation of children in our lobby area while testing is being conducted. An adult accompanying a patient to their appointment will only be allowed in the ultrasound room at the discretion of the ultrasound technician under special circumstances. We apologize for any inconvenience.    Follow-Up: At [facility], you and your health needs are our priority.  As part of our continuing mission to provide you with exceptional heart care, we have created designated Provider Care Teams.  These Care Teams include your primary Cardiologist (physician) and Advanced Practice Providers (APPs -  Physician Assistants and Nurse Practitioners) who all work together to provide you with the care you need, when you need it.  Your next appointment:   6 month(s)  ---after the echocardiogram  Provider:   Verne Carrow, MD

## 2024-05-18 ENCOUNTER — Ambulatory Visit (HOSPITAL_COMMUNITY)
Admission: RE | Admit: 2024-05-18 | Discharge: 2024-05-18 | Disposition: A | Discharge status: Home / Self Care | Source: Ambulatory Visit | Attending: Cardiovascular Disease | Admitting: Cardiovascular Disease

## 2024-05-18 DIAGNOSIS — I350 Nonrheumatic aortic (valve) stenosis: Secondary | ICD-10-CM

## 2024-05-19 LAB — ECHOCARDIOGRAM COMPLETE
AR max vel: 0.99 cm2
AV Area VTI: 0.93 cm2
AV Area mean vel: 0.89 cm2
AV Mean grad: 32 mmHg
AV Peak grad: 56.6 mmHg
Ao pk vel: 3.76 m/s
Area-P 1/2: 2.71 cm2
S' Lateral: 2.4 cm

## 2024-05-20 ENCOUNTER — Ambulatory Visit: Payer: Self-pay | Admitting: Cardiovascular Disease

## 2024-05-23 ENCOUNTER — Ambulatory Visit: Admitting: Cardiovascular Disease

## 2024-05-23 NOTE — Progress Notes (Deleted)
 Structural Heart Clinic Note  No chief complaint on file.  History of Present Illness: 78 yo female with history of CAD, hyperlipidemia, HTN, ongoing tobacco abuse and aortic stenosis who is here today for follow up. I saw her as a new consult in the structural heart clinic 12/23/22 for further discussion regarding her aortic stenosis and possible TAVR. She had an MI in 1997 and had complex stenting of the LAD and Diagonal branch. She had a stent placed in the left PDA in 2001 and balloon angioplasty of the non-dominant RCA at that time. Cardiac cath in 2007 with patency of the LAD and Circumflex stents. She has been followed for moderate aortic stenosis. She has had dizziness over the past few years which has improved some on Allegra  and meclizine  however her dizziness with standing persists. Cardiac monitor in January 2024 with Sinus, PACs, PVCs, short runs of SVT, 3.2 second pause with 2:1 AV block. Echo 11/18/22 with LVEF=60-65%. Mild AI. Moderately severe to severe paradoxical low flow/low gradient aortic stenosis (mean gradient 24 mmHg, AVA 0.83 cm2, SVI 31, DI 0.27). Visually her valve appears to have severe stenosis. We proceeded with planning for TAVR. Cardiac cath 12/26/22 with patent LAD/Diagonal stents with moderate restenosis, patent distal Circumflex stented segment with severe restenosis-not favorable for PCI. The non-dominant RCA is chronically occluded proximally and fills from left to right collaterals. Cardiac CTA with AV calcium score of 829. Annular area of 363 mm2. Low left main height at 5.5 mm. Her aortic stenosis was felt to be moderate based on the valve appearance on her the CT with AVA of 1.44 cm2. The annular area is suitable for a 23 mm Edwards valve or 26 mm Medtronic valve. She appears to have femoral access for TAVR. Her case was reviewed at the structural team meeting and her AS was felt to be moderate in early 2024. Echo 05/26/23 with LVEF=60-65%. Moderate aortic stenosis with  mean gradient 24 mmHg, AVA 1.0 cm2, SVI 37, DI 0.29. Echo 11/12/23 with LVEF=55-60%. Moderate AS with mean gradient 26.8 mmHg. She was doing well when I saw her in February 2025. Echo 05/19/24 with LVEF=60-65%. Moderate aortic stenosis with mean gradient 32 mmHg, AVA 0.90 cm2, SVI 43, DI 0.30.   She is here today for follow up. The patient denies any chest pain, dyspnea, palpitations, lower extremity edema, orthopnea, PND, dizziness, near syncope or syncope. She has baseline dyspnea. She continues to smoke cigarettes.    Of note, CTA with right adrenal mass. 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   She lives with her son in [place], [place]. She is retired. She owned a Science writer. She goes to the dentist regularly. She does not have dentures or any active dental issues.   Primary Care Physician: Corrington, Kip A, MD Primary Cardiologist: JINNY Ross Referring Cardiologist: JINNY Ross  Past Medical History:  Diagnosis Date   Bilateral carotid bruits    Coronary artery disease with history of myocardial infarction without history of CABG    h/o MI in 1997, s/p PCI x3 stents in 1997, 2 stents in 2000   Essential hypertension    GAD (generalized anxiety disorder)    GERD (gastroesophageal reflux disease)    Mixed hyperlipidemia    Myocardial infarction [facility]) 1997   Nonrheumatic aortic (valve) stenosis    S/P coronary artery stent placement     Past Surgical History:  Procedure Laterality Date  ABDOMINAL HYSTERECTOMY  1988   LAPAROSCOPIC GASTROTOMY W/ REPAIR OF ULCER  1992   LAPAROSCOPIC OOPHERECTOMY     PERCUTANEOUS CORONARY STENT INTERVENTION (PCI-S)     h/o MI in 1997, s/p PCI x3 stents in 1997, 2 stents in 2000   RIGHT/LEFT HEART CATH AND CORONARY ANGIOGRAPHY N/A 12/26/2022   Procedure: RIGHT/LEFT HEART CATH AND CORONARY ANGIOGRAPHY;  Surgeon: Verlin Lonni BIRCH, MD;  Location: [facility] INVASIVE CV LAB;  Service: Cardiovascular;   Laterality: N/A;    Current Outpatient Medications  Medication Sig Dispense Refill   albuterol  (VENTOLIN  HFA) 108 (90 Base) MCG/ACT inhaler Inhale 1 puff into the lungs every 6 (six) hours as needed for wheezing or shortness of breath.     amLODipine  (NORVASC ) 5 MG tablet TAKE ONE TABLET BY MOUTH EVERY DAY 300 tablet 0   aspirin  EC 81 MG tablet Take 81 mg by mouth at bedtime. Swallow whole.     atorvastatin (LIPITOR) 80 MG tablet Take 80 mg by mouth at bedtime.     Cholecalciferol (VITAMIN D3) 125 MCG (5000 UT) TABS Take 15,000 Units by mouth daily.     clindamycin (CLINDAGEL) 1 % gel Apply topically 2 (two) times daily as needed.     famotidine  (PEPCID ) 40 MG tablet Take 1 tablet (40 mg total) by mouth at bedtime. 30 tablet 0   fexofenadine  (ALLEGRA ) 180 MG tablet Take 1 tablet (180 mg total) by mouth daily. 100 tablet 1   furosemide (LASIX) 20 MG tablet Take 20 mg by mouth daily as needed for fluid or edema.     ibuprofen (ADVIL) 200 MG tablet Take 200 mg by mouth every 6 (six) hours as needed for mild pain or moderate pain (Arthritis).     LORazepam (ATIVAN) 0.5 MG tablet Take 0.5 mg by mouth at bedtime.     meclizine  (ANTIVERT ) 25 MG tablet Take 1 tablet (25 mg total) by mouth 3 (three) times daily as needed for dizziness. (Patient not taking: Reported on 11/16/2023) 90 tablet 0   mupirocin ointment (BACTROBAN) 2 % Apply 1 Application topically 3 (three) times daily.     nitroGLYCERIN  (NITROSTAT ) 0.4 MG SL tablet Place 1 tablet (0.4 mg total) under the tongue every 5 (five) minutes as needed for chest pain. 25 tablet 3   omeprazole  (PRILOSEC) 40 MG capsule Take 30- 60 min before your first and last meals of the day     No current facility-administered medications for this visit.    Allergies  Allergen Reactions   Erythromycin Other (See Comments)    Jaundice   Levocetirizine Shortness Of Breath    choking, can't breathe   Rosuvastatin Nausea And Vomiting    Other reaction(s):  Abdominal Pain, Bleeding, Constipation, Cough, Dizziness   Fosamax [Alendronate] Other (See Comments)    Muscle spasm     Social History   Socioeconomic History   Marital status: Divorced    Spouse name: Not on file   Number of children: 1   Years of education: Not on file   Highest education level: Not on file  Occupational History   Occupation: Retired-Ran a Science writer  Tobacco Use   Smoking status: Every Day    Current packs/day: 1.00    Average packs/day: 1 pack/day for 61.2 years (61.2 ttl pk-yrs)    Types: Cigarettes    Start date: 03/19/1963    Passive exposure: Never   Smokeless tobacco: Never   Tobacco comments:    smokes 5-10 cigarettes per day [facility] 12/31/21  Vaping Use   Vaping status: Never Used  Substance and Sexual Activity   Alcohol use: Not on file   Drug use: Not on file   Sexual activity: Not on file  Other Topics Concern   Not on file  Social History Narrative   Not on file   Social Drivers of Health   Financial Resource Strain: Medium Risk (11/22/2023)   Received from [facility]   Overall Financial Resource Strain (CARDIA)    Difficulty of Paying Living Expenses: Somewhat hard  Food Insecurity: Food Insecurity Present (11/22/2023)   Received from [facility]   Hunger Vital Sign    Within the past 12 months, you worried that your food would run out before you got the money to buy more.: Sometimes true    Within the past 12 months, the food you bought just didn't last and you didn't have money to get more.: Sometimes true  Transportation Needs: No Transportation Needs (11/22/2023)   Received from [facility] - Transportation    Lack of Transportation (Medical): No    Lack of Transportation (Non-Medical): No  Physical Activity: Insufficiently Active (11/22/2023)   Received from [facility]   Exercise Vital Sign    On average, how many days per week do you engage in moderate to strenuous exercise (like a brisk walk)?: 3 days     On average, how many minutes do you engage in exercise at this level?: 20 min  Stress: Stress Concern Present (11/22/2023)   Received from [facility] - Occupational Stress Questionnaire    Feeling of Stress : To some extent  Social Connections: Socially Integrated (11/22/2023)   Received from [facility]   Social Network    How would you rate your social network (family, work, friends)?: Good participation with social networks  Intimate Partner Violence: Not At Risk (11/22/2023)   Received from [facility]   HITS    Over the last 12 months how often did your partner physically hurt you?: Never    Over the last 12 months how often did your partner insult you or talk down to you?: Never    Over the last 12 months how often did your partner threaten you with physical harm?: Never    Over the last 12 months how often did your partner scream or curse at you?: Never    Family History  Problem Relation Age of Onset   Depression Mother    Diabetes Mother    Heart failure Mother    Heart attack Mother 4   Breast cancer Mother    Heart attack Father 30   Heart attack Sister    Diabetes Brother    Depression Brother    Heart attack Brother 50   Heart attack Daughter     Review of Systems:  As stated in the HPI and otherwise negative.   There were no vitals taken for this visit.  Physical Examination:  General: Well developed, well nourished, NAD  HEENT: OP clear, mucus membranes moist  SKIN: warm, dry. No rashes. Neuro: No focal deficits  Musculoskeletal: Muscle strength 5/5 all ext  Psychiatric: Mood and affect normal  Neck: No JVD, no carotid bruits, no thyromegaly, no lymphadenopathy.  Lungs:Clear bilaterally, no wheezes, rhonci, crackles Cardiovascular: Regular rate and rhythm. *** Harsh systolic murmur.  Abdomen:Soft. Bowel sounds present. Non-tender.  Extremities: No lower extremity edema. Pulses are 2 + in the bilateral  DP/PT.  EKG:  EKG is *** ordered today. The ekg ordered today demonstrates   Echo August 2025: 1. Left ventricular ejection fraction, by estimation, is 60 to 65%. Left  ventricular ejection fraction by 3D volume is 67 %. The left ventricle has  normal function. The left ventricle has no regional wall motion  abnormalities. Left ventricular diastolic   parameters are consistent with Grade I diastolic dysfunction (impaired  relaxation). The average left ventricular global longitudinal strain is  -18.1 %. The global longitudinal strain is normal.   2. Right ventricular systolic function is normal. The right ventricular  size is normal. There is normal pulmonary artery systolic pressure. The  estimated right ventricular systolic pressure is 21.1 mmHg.   3. The mitral valve is degenerative. Trivial mitral valve regurgitation.  No evidence of mitral stenosis.   4. The aortic valve is abnormal. There is severe calcifcation of the  aortic valve. Aortic valve regurgitation is trivial. Moderate aortic valve  stenosis. Aortic valve area, by VTI measures 0.93 cm. Aortic valve mean  gradient measures 32.0 mmHg. Aortic  valve Vmax measures 3.76 m/s, DVI 0.30.   5. The inferior vena cava is normal in size with greater than 50%  respiratory variability, suggesting right atrial pressure of 3 mmHg.   FINDINGS   Left Ventricle: Left ventricular ejection fraction, by estimation, is 60  to 65%. Left ventricular ejection fraction by 3D volume is 67 %. The left  ventricle has normal function. The left ventricle has no regional wall  motion abnormalities. The average  left ventricular global longitudinal strain is -18.1 %. Strain was  performed and the global longitudinal strain is normal. The left  ventricular internal cavity size was normal in size. There is no left  ventricular hypertrophy. Left ventricular diastolic  parameters are consistent with Grade I diastolic dysfunction (impaired   relaxation).   Right Ventricle: The right ventricular size is normal. No increase in  right ventricular wall thickness. Right ventricular systolic function is  normal. There is normal pulmonary artery systolic pressure. The tricuspid  regurgitant velocity is 2.13 m/s, and   with an assumed right atrial pressure of 3 mmHg, the estimated right  ventricular systolic pressure is 21.1 mmHg.   Left Atrium: Mildly reduced LA reservoir strain, 26.2%. Left atrial size  was normal in size.   Right Atrium: Right atrial size was normal in size.   Pericardium: There is no evidence of pericardial effusion.   Mitral Valve: The mitral valve is degenerative in appearance. Mild mitral  annular calcification. Trivial mitral valve regurgitation. No evidence of  mitral valve stenosis.   Tricuspid Valve: The tricuspid valve is grossly normal. Tricuspid valve  regurgitation is trivial. No evidence of tricuspid stenosis.   Aortic Valve: The aortic valve is abnormal. There is severe calcifcation  of the aortic valve. Aortic valve regurgitation is trivial. Moderate  aortic stenosis is present. Aortic valve mean gradient measures 32.0 mmHg.  Aortic valve peak gradient measures  56.6 mmHg. Aortic valve area, by VTI measures 0.93 cm.   Pulmonic Valve: The pulmonic valve was normal in structure. Pulmonic valve  regurgitation is trivial. No evidence of pulmonic stenosis.   Aorta: The aortic root is normal in size and structure.   Venous: The inferior vena cava is normal in size with greater than 50%  respiratory variability, suggesting right atrial pressure of 3 mmHg.   IAS/Shunts: No atrial level shunt detected by color flow Doppler.   Additional Comments: 3D was performed not requiring image post  processing  on an independent workstation and was normal.     LEFT VENTRICLE  PLAX 2D  LVIDd:         5.10 cm         Diastology  LVIDs:         2.40 cm         LV e' medial:    5.06 cm/s  LV PW:          0.60 cm         LV E/e' medial:  17.3  LV IVS:        0.70 cm         LV e' lateral:   6.64 cm/s  LVOT diam:     2.00 cm         LV E/e' lateral: 13.2  LV SV:         90  LV SV Index:   43              2D Longitudinal  LVOT Area:     3.14 cm        Strain                                 2D Strain GLS   -18.5 %                                 (A4C):                                 2D Strain GLS   -19.5 %                                 (A3C):                                 2D Strain GLS   -16.2 %                                 (A2C):                                 2D Strain GLS   -18.1 %                                 Avg:                                   3D Volume EF                                 LV 3D EF:    Left  ventricul                                              ar                                              ejection                                              fraction                                              by 3D                                              volume is                                              67 %.                                   3D Volume EF:                                 3D EF:        67 %                                 LV EDV:       178 ml                                 LV ESV:       58 ml                                 LV SV:        120 ml   RIGHT VENTRICLE             IVC  RV Basal diam:  3.20 cm     IVC diam: 1.80 cm  RV Mid diam:    2.00 cm  RV S prime:     15.00 cm/s  TAPSE (M-mode): 3.0 cm  RVSP:           21.1 mmHg   LEFT ATRIUM             Index        RIGHT ATRIUM           Index  LA diam:  3.50 cm 1.69 cm/m   RA Pressure: 3.00 mmHg  LA Vol (A2C):   43.2 ml 20.89 ml/m  RA Area:     9.53 cm  LA Vol (A4C):   43.7 ml 21.13 ml/m  RA Volume:   20.00 ml  9.67 ml/m  LA Biplane Vol: 49.7 ml 24.03 ml/m   AORTIC VALVE  AV Area (Vmax):    0.99 cm  AV Area (Vmean):    0.89 cm  AV Area (VTI):     0.93 cm  AV Vmax:           376.00 cm/s  AV Vmean:          268.000 cm/s  AV VTI:            0.958 m  AV Peak Grad:      56.6 mmHg  AV Mean Grad:      32.0 mmHg  LVOT Vmax:         118.00 cm/s  LVOT Vmean:        76.000 cm/s  LVOT VTI:          0.285 m  LVOT/AV VTI ratio: 0.30    AORTA  Ao Root diam: 3.40 cm  Ao Asc diam:  3.30 cm   MITRAL VALVE                TRICUSPID VALVE  MV Area (PHT):              TR Peak grad:   18.1 mmHg  MV Decel Time:              TR Vmax:        213.00 cm/s  MV E velocity: 87.40 cm/s   Estimated RAP:  3.00 mmHg  MV A velocity: 117.00 cm/s  RVSP:           21.1 mmHg  MV E/A ratio:  0.75                              SHUNTS                              Systemic VTI:  0.29 m                              Systemic Diam: 2.00 cm   Cardiac CT 01/02/23: Valve Morphology: Tricuspid aortic valve with moderate diffuse calcifications. Leaflets are moderately restricted in systole. AVA 1.44 cm2   Aortic Valve Calcium score: 829   Aortic annular dimension:   Phase assessed: 15%   Annular area: 363 mm2   Annular perimeter: 68.6 mm   Max diameter: 23.5 mm   Min diameter: 20.3 mm   Annular and subannular calcification: None.   Membranous septum length: 6.1 mm   Optimal coplanar projection: LAO 5 CAU 4   Coronary Artery Height above Annulus:   Left Main: 5.5 mm   Right Coronary: 15.5 mm   Sinus of Valsalva Measurements:   Non-coronary: 34 mm   Right-coronary: 34 mm   Left-coronary: 34 mm   Sinus of Valsalva Height:   Non-coronary: 25.8 mm   Right-coronary: 15.5 mm   Left-coronary: 18.0 mm   Sinotubular Junction: 27 mm   Ascending Thoracic Aorta: 33 mm   Coronary Arteries: Normal coronary origin. Right dominance. The study was performed  without use of NTG and is insufficient for plaque evaluation. Please refer to recent cardiac catheterization for coronary assessment.   Cardiac Morphology:   Right  Atrium: Right atrial size is within normal limits.   Right Ventricle: The right ventricular cavity is within normal limits.   Left Atrium: Left atrial size is normal in size with no left atrial appendage filling defect.   Left Ventricle: The ventricular cavity size is within normal limits. Inferoapical akinesis noted.   Pulmonary arteries: Normal in size without proximal filling defect.   Pulmonary veins: Normal pulmonary venous drainage.   Pericardium: Normal thickness with no significant effusion or calcium present.   Mitral Valve: The mitral valve is normal structure without significant calcification.   Extra-cardiac findings: See attached radiology report for non-cardiac structures.   IMPRESSION: 1. Moderate aortic valve calcifications. Aortic valve calcium score 829.   2. Annular measurements support a 23 mm S3 TAVR or 26 mm Evolut Pro.   3. No significant annular or subannular calcifications.   4. Shallow LCA height at 5.5 mm. Large sinuses noted. Structural heart team discussion recommended.   5. Optimal Fluoroscopic Angle for Delivery: LAO 5 CAU 4   6. Inferoapical akinesis noted.   CTA CHEST FINDINGS   Cardiovascular: Normal heart size. No pericardial effusion. Aortic valve thickening and calcifications. Normal caliber thoracic aorta with moderate atherosclerotic disease. Standard three-vessel aortic arch. Severe narrowing of the left subclavian artery secondary to calcified plaque. Severe three-vessel coronary artery calcifications.   Mediastinum/Nodes: Esophagus and thyroid are unremarkable. No enlarged lymph nodes seen in the chest.   Lungs/Pleura: Central airways are patent. 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 No consolidation, pleural effusion or pneumothorax.   Musculoskeletal: No chest wall abnormality. No acute or significant osseous findings.   CTA ABDOMEN AND PELVIS FINDINGS    Hepatobiliary: No focal liver abnormality is seen. No gallstones, gallbladder wall thickening, or biliary dilatation.   Pancreas: Cystic lesion of the uncinate process of the pancreas measuring 11 mm on series 6, image 146. No main pancreatic duct dilation.   Spleen: Normal in size without focal abnormality.   Adrenals/Urinary Tract: Indeterminate right adrenal gland nodule measuring 2.8 cm. Left adrenal gland is unremarkable. No hydronephrosis or nephrolithiasis. Bladder is unremarkable.   Stomach/Bowel: Stomach is within normal limits. Appendix appears normal. Diverticulosis. No evidence of bowel wall thickening, distention, or inflammatory changes.   Vascular/lymphatic: Focal dilation of the infrarenal abdominal aorta measuring up to 2.0 cm, not technically aneurysmal. Moderate atherosclerotic disease of the abdominal aorta. Moderate narrowing of the right renal artery due to calcified plaque, otherwise no significant stenosis. No enlarged lymph nodes seen in the abdomen or pelvis.   Reproductive: Status post hysterectomy. No adnexal masses.   Other: Moderate fat containing ventral abdominal hernia. No abdominopelvic ascites.   Musculoskeletal: No acute or significant osseous findings.   VASCULAR MEASUREMENTS PERTINENT TO TAVR:   AORTA:   Minimal Aortic Diameter-12.5 mm   Severity of Aortic Calcification-moderate   RIGHT PELVIS:   Right Common Iliac Artery -   Minimal Diameter-4.6 mm   Tortuosity-severe   Calcification-moderate   Right External Iliac Artery -   Minimal Diameter-6.3 mm   Tortuosity-mild   Calcification-none   Right Common Femoral Artery -   Minimal Diameter-7.4 mm   Tortuosity-none   Calcification-mild   LEFT PELVIS:   Left Common Iliac Artery -   Minimal Diameter-8.0 mm   Tortuosity-mild  Calcification-moderate   Left External Iliac Artery -   Minimal Diameter-7.0 mm   Tortuosity-mild   Calcification-none   Left  Common Femoral Artery -   Minimal Diameter-5.6 mm   Tortuosity-mild   Calcification-mild   Review of the MIP images confirms the above findings.   IMPRESSION: Vascular:   1. Vascular findings and measurements pertinent to potential TAVR procedure, as detailed above. 2. Thickening and calcification of the aortic valve, compatible with reported clinical history of aortic stenosis. 3. Moderate aortoiliac atherosclerosis. Severe three-vessel coronary artery calcifications.   Nonvascular:   1. Indeterminate right adrenal gland nodule measuring 2.8 cm. Recommend adrenal protocol CT for further evaluation. 2. 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 Recommend follow up pre and post contrast MRI/MRCP or pancreatic protocol CT in 2 years.  Cardiac cath 12/26/22:    Prox RCA to Mid RCA lesion is 100% stenosed.   3rd LPL lesion is 70% stenosed.   LPAV lesion is 90% stenosed.   Prox LAD to Mid LAD lesion is 50% stenosed.   1st Diag lesion is 50% stenosed.   The LAD is a large caliber vessel that courses to the apex. The stented segment of the mid LAD at the takeoff of the Diagonal branch is patent. The bifurcation stenting of the LAD and Diagonal is patent. There is moderate restenosis in the LAD and Diagonal stents but this does not appear to be flow limiting.    The Circumflex is a large dominant vessel. The proximal and mid Circumflex is patent with mild plaque disease. The stented segment of the distal AV groove Circumflex leading into the most distal obtuse marginal branch has severe stent restenosis throughout. There is also diffuse disease in the small caliber obtuse marginal branch beyond the stent. This is not favorable for PCI.    The RCA is a moderate caliber non-dominant vessel with chronic total occlusion of the proximal vessel. The mid and distal RCA fills from left to right collaterals.    She is known to have  paradoxical low flow/low gradient severe aortic stenosis by echo. Peak to peak gradient on cath today 10 mmHg.    Recommendations: Medical management of CAD. Smoking cessation. Continue TAVR workup.    Diagnostic Dominance: Co-dominant Left Anterior Descending  Prox LAD to Mid LAD lesion is 50% stenosed. The lesion was previously treated using a bare metal stent over 2 years ago.    First Diagonal Branch  1st Diag lesion is 50% stenosed. The lesion was previously treated using a bare metal stent over 2 years ago.    Left Circumflex    Third Left Posterolateral Branch  3rd LPL lesion is 70% stenosed.    Left Posterior Atrioventricular Artery  LPAV lesion is 90% stenosed. The lesion was previously treated using a stent (unknown type) over 2 years ago.    Right Coronary Artery  Vessel is moderate in size.  Prox RCA to Mid RCA lesion is 100% stenosed. The lesion is chronically occluded.    Right Posterior Descending Artery  Collaterals  RPDA filled by collaterals from 2nd Sept.      Intervention   No interventions have been documented.   Coronary Diagrams  Diagnostic Dominance: Co-dominant  Intervention   Implants   No implant documentation for this case.   Syngo Images   Show images for CARDIAC CATHETERIZATION Images on Long Term Storage   Show images for Merlin, Ege  to Procedure Log  Procedure Log    Hemo Data  Flowsheet Row Most Recent Value  Fick Cardiac Output 7.85 L/min  Fick Cardiac Output Index 3.82 (L/min)/BSA  Aortic Mean Gradient 0 mmHg  Aortic Peak Gradient 0 mmHg  Aortic Valve Area 1.64  Aortic Value Area Index 0.8 cm2/BSA  RA A Wave 12 mmHg  RA V Wave 8 mmHg  RA Mean 4 mmHg  RV Systolic Pressure 36 mmHg  RV Diastolic Pressure 2 mmHg  RV EDP 10 mmHg  PA Systolic Pressure 36 mmHg  PA Diastolic Pressure 8 mmHg  PA Mean 21 mmHg  PW A Wave 15 mmHg  PW V Wave 13 mmHg  PW Mean 7 mmHg  AO Systolic Pressure 140 mmHg  AO Diastolic  Pressure 64 mmHg  AO Mean 91 mmHg  LV Systolic Pressure 159 mmHg  LV Diastolic Pressure 9 mmHg  LV EDP 13 mmHg  AOp Systolic Pressure 146 mmHg  AOp Diastolic Pressure 68 mmHg  AOp Mean Pressure 97 mmHg  LVp Systolic Pressure 166 mmHg  LVp Diastolic Pressure 92 mmHg  LVp EDP Pressure 22 mmHg  QP/QS 1  TPVR Index 5.5 HRUI  TSVR Index 22.79 HRUI  PVR SVR Ratio 0.17  TPVR/TSVR Ratio 0.24    Recent Labs: No results found for requested labs within last 365 days.   Lipid Panel No results found for: CHOL, TRIG, HDL, CHOLHDL, VLDL, LDLCALC, LDLDIRECT   Wt Readings from Last 3 Encounters:  11/16/23 222 lb (100.7 kg)  05/28/23 223 lb 9.6 oz (101.4 kg)  02/12/23 228 lb 3.2 oz (103.5 kg)    Assessment and Plan:   1. Moderately Severe Aortic Valve Stenosis: Her most recent echo remains consistent with moderate aortic stenosis. NYHA class ***. She is at high risk for a pacemaker when we elect to proceed with TAVR given risk for high grade heart block following TAVR (baseline RBBB, LAFB and Mobitz 1 AV block). Her left main height is low and there is risk of occlusion of the left main with TAVR although her sinuses seem to be big enough that this may not be an issue.   I have reviewed the natural history of aortic stenosis with the patient and their family members  who are present today. We have discussed the limitations of medical therapy and the poor prognosis associated with symptomatic aortic stenosis. We have reviewed potential treatment options, including palliative medical therapy, conventional surgical aortic valve replacement, and transcatheter aortic valve replacement. We discussed treatment options in the context of the patient's specific comorbid medical conditions.   Follow aortic stenosis for now. Repeat echo in 6 months. .    2. CAD: No chest pain. CAD stable by cath in 2024. Continue ASA and statin.   3. HTN: BP is controlled. Continue Norvasc .   Labs/ tests  ordered today include:  No orders of the defined types were placed in this encounter.  Disposition:   F/U with me in 6 months.   Signed, Lonni Cash, MD, [facility] 05/23/2024 7:19 AM    [facility] [address] [place], [place], [place]  [address] Phone: [phone]; Fax: [phone]

## 2024-06-07 NOTE — Progress Notes (Unsigned)
 Structural Heart Clinic Note  No chief complaint on file.  History of Present Illness: 78 yo female with history of CAD, hyperlipidemia, HTN, ongoing tobacco abuse and aortic stenosis who is here today for follow up. I saw her as a new consult in the structural heart clinic 12/23/22 for further discussion regarding her aortic stenosis and possible TAVR. She had an MI in 1997 and had complex stenting of the LAD and Diagonal branch. She had a stent placed in the left PDA in 2001 and balloon angioplasty of the non-dominant RCA at that time. Cardiac cath in 2007 with patency of the LAD and Circumflex stents. She has been followed for moderate aortic stenosis. She has had dizziness over the past few years which has improved some on Allegra  and meclizine  however her dizziness with standing persists. Cardiac monitor in January 2024 with Sinus, PACs, PVCs, short runs of SVT, 3.2 second pause with 2:1 AV block. Echo 11/18/22 with LVEF=60-65%. Mild AI. Moderately severe to severe paradoxical low flow/low gradient aortic stenosis (mean gradient 24 mmHg, AVA 0.83 cm2, SVI 31, DI 0.27). Visually her valve appears to have severe stenosis. We proceeded with planning for TAVR. Cardiac cath 12/26/22 with patent LAD/Diagonal stents with moderate restenosis, patent distal Circumflex stented segment with severe restenosis-not favorable for PCI. The non-dominant RCA is chronically occluded proximally and fills from left to right collaterals. Cardiac CTA with AV calcium score of 829. Annular area of 363 mm2. Low left main height at 5.5 mm. Her aortic stenosis was felt to be moderate based on the valve appearance on her the CT with AVA of 1.44 cm2. The annular area is suitable for a 23 mm Edwards valve or 26 mm Medtronic valve. She appears to have femoral access for TAVR. Her case was reviewed at the structural team meeting and her AS was felt to be moderate in early 2024. Echo 05/26/23 with LVEF=60-65%. Moderate aortic stenosis with  mean gradient 24 mmHg, AVA 1.0 cm2, SVI 37, DI 0.29. Echo 11/12/23 with LVEF=55-60%. Moderate AS with mean gradient 26.8 mmHg. She was doing well when I saw her in February 2025. Echo 05/19/24 with LVEF=60-65%. Moderate aortic stenosis with mean gradient 32 mmHg, AVA 0.90 cm2, SVI 43, DI 0.30.   She is here today for follow up. The patient denies any chest pain, dyspnea, palpitations, lower extremity edema, orthopnea, PND, dizziness, near syncope or syncope. She has baseline dyspnea. She continues to smoke cigarettes.    Of note, CTA with right adrenal mass. 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   She lives with her son in [place], [place]. She is retired. She owned a Science writer. She goes to the dentist regularly. She does not have dentures or any active dental issues.   Primary Care Physician: Corrington, Kip A, MD Primary Cardiologist: JINNY Ross Referring Cardiologist: JINNY Ross  Past Medical History:  Diagnosis Date   Bilateral carotid bruits    Coronary artery disease with history of myocardial infarction without history of CABG    h/o MI in 1997, s/p PCI x3 stents in 1997, 2 stents in 2000   Essential hypertension    GAD (generalized anxiety disorder)    GERD (gastroesophageal reflux disease)    Mixed hyperlipidemia    Myocardial infarction [facility]) 1997   Nonrheumatic aortic (valve) stenosis    S/P coronary artery stent placement     Past Surgical History:  Procedure Laterality Date  ABDOMINAL HYSTERECTOMY  1988   LAPAROSCOPIC GASTROTOMY W/ REPAIR OF ULCER  1992   LAPAROSCOPIC OOPHERECTOMY     PERCUTANEOUS CORONARY STENT INTERVENTION (PCI-S)     h/o MI in 1997, s/p PCI x3 stents in 1997, 2 stents in 2000   RIGHT/LEFT HEART CATH AND CORONARY ANGIOGRAPHY N/A 12/26/2022   Procedure: RIGHT/LEFT HEART CATH AND CORONARY ANGIOGRAPHY;  Surgeon: Verlin Lonni BIRCH, MD;  Location: [facility] INVASIVE CV LAB;  Service: Cardiovascular;   Laterality: N/A;    Current Outpatient Medications  Medication Sig Dispense Refill   albuterol  (VENTOLIN  HFA) 108 (90 Base) MCG/ACT inhaler Inhale 1 puff into the lungs every 6 (six) hours as needed for wheezing or shortness of breath.     amLODipine  (NORVASC ) 5 MG tablet TAKE ONE TABLET BY MOUTH EVERY DAY 300 tablet 0   aspirin  EC 81 MG tablet Take 81 mg by mouth at bedtime. Swallow whole.     atorvastatin (LIPITOR) 80 MG tablet Take 80 mg by mouth at bedtime.     Cholecalciferol (VITAMIN D3) 125 MCG (5000 UT) TABS Take 15,000 Units by mouth daily.     clindamycin (CLINDAGEL) 1 % gel Apply topically 2 (two) times daily as needed.     famotidine  (PEPCID ) 40 MG tablet Take 1 tablet (40 mg total) by mouth at bedtime. 30 tablet 0   fexofenadine  (ALLEGRA ) 180 MG tablet Take 1 tablet (180 mg total) by mouth daily. 100 tablet 1   furosemide (LASIX) 20 MG tablet Take 20 mg by mouth daily as needed for fluid or edema.     ibuprofen (ADVIL) 200 MG tablet Take 200 mg by mouth every 6 (six) hours as needed for mild pain or moderate pain (Arthritis).     LORazepam (ATIVAN) 0.5 MG tablet Take 0.5 mg by mouth at bedtime.     meclizine  (ANTIVERT ) 25 MG tablet Take 1 tablet (25 mg total) by mouth 3 (three) times daily as needed for dizziness. (Patient not taking: Reported on 11/16/2023) 90 tablet 0   mupirocin ointment (BACTROBAN) 2 % Apply 1 Application topically 3 (three) times daily.     nitroGLYCERIN  (NITROSTAT ) 0.4 MG SL tablet Place 1 tablet (0.4 mg total) under the tongue every 5 (five) minutes as needed for chest pain. 25 tablet 3   omeprazole  (PRILOSEC) 40 MG capsule Take 30- 60 min before your first and last meals of the day     No current facility-administered medications for this visit.    Allergies  Allergen Reactions   Erythromycin Other (See Comments)    Jaundice   Levocetirizine Shortness Of Breath    choking, can't breathe   Rosuvastatin Nausea And Vomiting    Other reaction(s):  Abdominal Pain, Bleeding, Constipation, Cough, Dizziness   Fosamax [Alendronate] Other (See Comments)    Muscle spasm     Social History   Socioeconomic History   Marital status: Divorced    Spouse name: Not on file   Number of children: 1   Years of education: Not on file   Highest education level: Not on file  Occupational History   Occupation: Retired-Ran a Science writer  Tobacco Use   Smoking status: Every Day    Current packs/day: 1.00    Average packs/day: 1 pack/day for 61.2 years (61.2 ttl pk-yrs)    Types: Cigarettes    Start date: 03/19/1963    Passive exposure: Never   Smokeless tobacco: Never   Tobacco comments:    smokes 5-10 cigarettes per day [facility] 12/31/21  Vaping Use   Vaping status: Never Used  Substance and Sexual Activity   Alcohol use: Not on file   Drug use: Not on file   Sexual activity: Not on file  Other Topics Concern   Not on file  Social History Narrative   Not on file   Social Drivers of Health   Financial Resource Strain: Medium Risk (11/22/2023)   Received from [facility]   Overall Financial Resource Strain (CARDIA)    Difficulty of Paying Living Expenses: Somewhat hard  Food Insecurity: Food Insecurity Present (11/22/2023)   Received from [facility]   Hunger Vital Sign    Within the past 12 months, you worried that your food would run out before you got the money to buy more.: Sometimes true    Within the past 12 months, the food you bought just didn't last and you didn't have money to get more.: Sometimes true  Transportation Needs: No Transportation Needs (11/22/2023)   Received from [facility] - Transportation    Lack of Transportation (Medical): No    Lack of Transportation (Non-Medical): No  Physical Activity: Insufficiently Active (11/22/2023)   Received from [facility]   Exercise Vital Sign    On average, how many days per week do you engage in moderate to strenuous exercise (like a brisk walk)?: 3 days     On average, how many minutes do you engage in exercise at this level?: 20 min  Stress: Stress Concern Present (11/22/2023)   Received from [facility] - Occupational Stress Questionnaire    Feeling of Stress : To some extent  Social Connections: Socially Integrated (11/22/2023)   Received from [facility]   Social Network    How would you rate your social network (family, work, friends)?: Good participation with social networks  Intimate Partner Violence: Not At Risk (11/22/2023)   Received from [facility]   HITS    Over the last 12 months how often did your partner physically hurt you?: Never    Over the last 12 months how often did your partner insult you or talk down to you?: Never    Over the last 12 months how often did your partner threaten you with physical harm?: Never    Over the last 12 months how often did your partner scream or curse at you?: Never    Family History  Problem Relation Age of Onset   Depression Mother    Diabetes Mother    Heart failure Mother    Heart attack Mother 11   Breast cancer Mother    Heart attack Father 58   Heart attack Sister    Diabetes Brother    Depression Brother    Heart attack Brother 50   Heart attack Daughter     Review of Systems:  As stated in the HPI and otherwise negative.   There were no vitals taken for this visit.  Physical Examination:  General: Well developed, well nourished, NAD  HEENT: OP clear, mucus membranes moist  SKIN: warm, dry. No rashes. Neuro: No focal deficits  Musculoskeletal: Muscle strength 5/5 all ext  Psychiatric: Mood and affect normal  Neck: No JVD, no carotid bruits, no thyromegaly, no lymphadenopathy.  Lungs:Clear bilaterally, no wheezes, rhonci, crackles Cardiovascular: Regular rate and rhythm. *** Harsh systolic murmur.  Abdomen:Soft. Bowel sounds present. Non-tender.  Extremities: No lower extremity edema. Pulses are 2 + in the bilateral  DP/PT.  EKG:  EKG is *** ordered today. The ekg ordered today demonstrates   Echo August 2025: 1. Left ventricular ejection fraction, by estimation, is 60 to 65%. Left  ventricular ejection fraction by 3D volume is 67 %. The left ventricle has  normal function. The left ventricle has no regional wall motion  abnormalities. Left ventricular diastolic   parameters are consistent with Grade I diastolic dysfunction (impaired  relaxation). The average left ventricular global longitudinal strain is  -18.1 %. The global longitudinal strain is normal.   2. Right ventricular systolic function is normal. The right ventricular  size is normal. There is normal pulmonary artery systolic pressure. The  estimated right ventricular systolic pressure is 21.1 mmHg.   3. The mitral valve is degenerative. Trivial mitral valve regurgitation.  No evidence of mitral stenosis.   4. The aortic valve is abnormal. There is severe calcifcation of the  aortic valve. Aortic valve regurgitation is trivial. Moderate aortic valve  stenosis. Aortic valve area, by VTI measures 0.93 cm. Aortic valve mean  gradient measures 32.0 mmHg. Aortic  valve Vmax measures 3.76 m/s, DVI 0.30.   5. The inferior vena cava is normal in size with greater than 50%  respiratory variability, suggesting right atrial pressure of 3 mmHg.   FINDINGS   Left Ventricle: Left ventricular ejection fraction, by estimation, is 60  to 65%. Left ventricular ejection fraction by 3D volume is 67 %. The left  ventricle has normal function. The left ventricle has no regional wall  motion abnormalities. The average  left ventricular global longitudinal strain is -18.1 %. Strain was  performed and the global longitudinal strain is normal. The left  ventricular internal cavity size was normal in size. There is no left  ventricular hypertrophy. Left ventricular diastolic  parameters are consistent with Grade I diastolic dysfunction (impaired   relaxation).   Right Ventricle: The right ventricular size is normal. No increase in  right ventricular wall thickness. Right ventricular systolic function is  normal. There is normal pulmonary artery systolic pressure. The tricuspid  regurgitant velocity is 2.13 m/s, and   with an assumed right atrial pressure of 3 mmHg, the estimated right  ventricular systolic pressure is 21.1 mmHg.   Left Atrium: Mildly reduced LA reservoir strain, 26.2%. Left atrial size  was normal in size.   Right Atrium: Right atrial size was normal in size.   Pericardium: There is no evidence of pericardial effusion.   Mitral Valve: The mitral valve is degenerative in appearance. Mild mitral  annular calcification. Trivial mitral valve regurgitation. No evidence of  mitral valve stenosis.   Tricuspid Valve: The tricuspid valve is grossly normal. Tricuspid valve  regurgitation is trivial. No evidence of tricuspid stenosis.   Aortic Valve: The aortic valve is abnormal. There is severe calcifcation  of the aortic valve. Aortic valve regurgitation is trivial. Moderate  aortic stenosis is present. Aortic valve mean gradient measures 32.0 mmHg.  Aortic valve peak gradient measures  56.6 mmHg. Aortic valve area, by VTI measures 0.93 cm.   Pulmonic Valve: The pulmonic valve was normal in structure. Pulmonic valve  regurgitation is trivial. No evidence of pulmonic stenosis.   Aorta: The aortic root is normal in size and structure.   Venous: The inferior vena cava is normal in size with greater than 50%  respiratory variability, suggesting right atrial pressure of 3 mmHg.   IAS/Shunts: No atrial level shunt detected by color flow Doppler.   Additional Comments: 3D was performed not requiring image post  processing  on an independent workstation and was normal.     LEFT VENTRICLE  PLAX 2D  LVIDd:         5.10 cm         Diastology  LVIDs:         2.40 cm         LV e' medial:    5.06 cm/s  LV PW:          0.60 cm         LV E/e' medial:  17.3  LV IVS:        0.70 cm         LV e' lateral:   6.64 cm/s  LVOT diam:     2.00 cm         LV E/e' lateral: 13.2  LV SV:         90  LV SV Index:   43              2D Longitudinal  LVOT Area:     3.14 cm        Strain                                 2D Strain GLS   -18.5 %                                 (A4C):                                 2D Strain GLS   -19.5 %                                 (A3C):                                 2D Strain GLS   -16.2 %                                 (A2C):                                 2D Strain GLS   -18.1 %                                 Avg:                                   3D Volume EF                                 LV 3D EF:    Left  ventricul                                              ar                                              ejection                                              fraction                                              by 3D                                              volume is                                              67 %.                                   3D Volume EF:                                 3D EF:        67 %                                 LV EDV:       178 ml                                 LV ESV:       58 ml                                 LV SV:        120 ml   RIGHT VENTRICLE             IVC  RV Basal diam:  3.20 cm     IVC diam: 1.80 cm  RV Mid diam:    2.00 cm  RV S prime:     15.00 cm/s  TAPSE (M-mode): 3.0 cm  RVSP:           21.1 mmHg   LEFT ATRIUM             Index        RIGHT ATRIUM           Index  LA diam:  3.50 cm 1.69 cm/m   RA Pressure: 3.00 mmHg  LA Vol (A2C):   43.2 ml 20.89 ml/m  RA Area:     9.53 cm  LA Vol (A4C):   43.7 ml 21.13 ml/m  RA Volume:   20.00 ml  9.67 ml/m  LA Biplane Vol: 49.7 ml 24.03 ml/m   AORTIC VALVE  AV Area (Vmax):    0.99 cm  AV Area (Vmean):    0.89 cm  AV Area (VTI):     0.93 cm  AV Vmax:           376.00 cm/s  AV Vmean:          268.000 cm/s  AV VTI:            0.958 m  AV Peak Grad:      56.6 mmHg  AV Mean Grad:      32.0 mmHg  LVOT Vmax:         118.00 cm/s  LVOT Vmean:        76.000 cm/s  LVOT VTI:          0.285 m  LVOT/AV VTI ratio: 0.30    AORTA  Ao Root diam: 3.40 cm  Ao Asc diam:  3.30 cm   MITRAL VALVE                TRICUSPID VALVE  MV Area (PHT):              TR Peak grad:   18.1 mmHg  MV Decel Time:              TR Vmax:        213.00 cm/s  MV E velocity: 87.40 cm/s   Estimated RAP:  3.00 mmHg  MV A velocity: 117.00 cm/s  RVSP:           21.1 mmHg  MV E/A ratio:  0.75                              SHUNTS                              Systemic VTI:  0.29 m                              Systemic Diam: 2.00 cm   Cardiac CT 01/02/23: Valve Morphology: Tricuspid aortic valve with moderate diffuse calcifications. Leaflets are moderately restricted in systole. AVA 1.44 cm2   Aortic Valve Calcium score: 829   Aortic annular dimension:   Phase assessed: 15%   Annular area: 363 mm2   Annular perimeter: 68.6 mm   Max diameter: 23.5 mm   Min diameter: 20.3 mm   Annular and subannular calcification: None.   Membranous septum length: 6.1 mm   Optimal coplanar projection: LAO 5 CAU 4   Coronary Artery Height above Annulus:   Left Main: 5.5 mm   Right Coronary: 15.5 mm   Sinus of Valsalva Measurements:   Non-coronary: 34 mm   Right-coronary: 34 mm   Left-coronary: 34 mm   Sinus of Valsalva Height:   Non-coronary: 25.8 mm   Right-coronary: 15.5 mm   Left-coronary: 18.0 mm   Sinotubular Junction: 27 mm   Ascending Thoracic Aorta: 33 mm   Coronary Arteries: Normal coronary origin. Right dominance. The study was performed  without use of NTG and is insufficient for plaque evaluation. Please refer to recent cardiac catheterization for coronary assessment.   Cardiac Morphology:   Right  Atrium: Right atrial size is within normal limits.   Right Ventricle: The right ventricular cavity is within normal limits.   Left Atrium: Left atrial size is normal in size with no left atrial appendage filling defect.   Left Ventricle: The ventricular cavity size is within normal limits. Inferoapical akinesis noted.   Pulmonary arteries: Normal in size without proximal filling defect.   Pulmonary veins: Normal pulmonary venous drainage.   Pericardium: Normal thickness with no significant effusion or calcium present.   Mitral Valve: The mitral valve is normal structure without significant calcification.   Extra-cardiac findings: See attached radiology report for non-cardiac structures.   IMPRESSION: 1. Moderate aortic valve calcifications. Aortic valve calcium score 829.   2. Annular measurements support a 23 mm S3 TAVR or 26 mm Evolut Pro.   3. No significant annular or subannular calcifications.   4. Shallow LCA height at 5.5 mm. Large sinuses noted. Structural heart team discussion recommended.   5. Optimal Fluoroscopic Angle for Delivery: LAO 5 CAU 4   6. Inferoapical akinesis noted.   CTA CHEST FINDINGS   Cardiovascular: Normal heart size. No pericardial effusion. Aortic valve thickening and calcifications. Normal caliber thoracic aorta with moderate atherosclerotic disease. Standard three-vessel aortic arch. Severe narrowing of the left subclavian artery secondary to calcified plaque. Severe three-vessel coronary artery calcifications.   Mediastinum/Nodes: Esophagus and thyroid are unremarkable. No enlarged lymph nodes seen in the chest.   Lungs/Pleura: Central airways are patent. 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 No consolidation, pleural effusion or pneumothorax.   Musculoskeletal: No chest wall abnormality. No acute or significant osseous findings.   CTA ABDOMEN AND PELVIS FINDINGS    Hepatobiliary: No focal liver abnormality is seen. No gallstones, gallbladder wall thickening, or biliary dilatation.   Pancreas: Cystic lesion of the uncinate process of the pancreas measuring 11 mm on series 6, image 146. No main pancreatic duct dilation.   Spleen: Normal in size without focal abnormality.   Adrenals/Urinary Tract: Indeterminate right adrenal gland nodule measuring 2.8 cm. Left adrenal gland is unremarkable. No hydronephrosis or nephrolithiasis. Bladder is unremarkable.   Stomach/Bowel: Stomach is within normal limits. Appendix appears normal. Diverticulosis. No evidence of bowel wall thickening, distention, or inflammatory changes.   Vascular/lymphatic: Focal dilation of the infrarenal abdominal aorta measuring up to 2.0 cm, not technically aneurysmal. Moderate atherosclerotic disease of the abdominal aorta. Moderate narrowing of the right renal artery due to calcified plaque, otherwise no significant stenosis. No enlarged lymph nodes seen in the abdomen or pelvis.   Reproductive: Status post hysterectomy. No adnexal masses.   Other: Moderate fat containing ventral abdominal hernia. No abdominopelvic ascites.   Musculoskeletal: No acute or significant osseous findings.   VASCULAR MEASUREMENTS PERTINENT TO TAVR:   AORTA:   Minimal Aortic Diameter-12.5 mm   Severity of Aortic Calcification-moderate   RIGHT PELVIS:   Right Common Iliac Artery -   Minimal Diameter-4.6 mm   Tortuosity-severe   Calcification-moderate   Right External Iliac Artery -   Minimal Diameter-6.3 mm   Tortuosity-mild   Calcification-none   Right Common Femoral Artery -   Minimal Diameter-7.4 mm   Tortuosity-none   Calcification-mild   LEFT PELVIS:   Left Common Iliac Artery -   Minimal Diameter-8.0 mm   Tortuosity-mild  Calcification-moderate   Left External Iliac Artery -   Minimal Diameter-7.0 mm   Tortuosity-mild   Calcification-none   Left  Common Femoral Artery -   Minimal Diameter-5.6 mm   Tortuosity-mild   Calcification-mild   Review of the MIP images confirms the above findings.   IMPRESSION: Vascular:   1. Vascular findings and measurements pertinent to potential TAVR procedure, as detailed above. 2. Thickening and calcification of the aortic valve, compatible with reported clinical history of aortic stenosis. 3. Moderate aortoiliac atherosclerosis. Severe three-vessel coronary artery calcifications.   Nonvascular:   1. Indeterminate right adrenal gland nodule measuring 2.8 cm. Recommend adrenal protocol CT for further evaluation. 2. 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 Recommend follow up pre and post contrast MRI/MRCP or pancreatic protocol CT in 2 years.  Cardiac cath 12/26/22:    Prox RCA to Mid RCA lesion is 100% stenosed.   3rd LPL lesion is 70% stenosed.   LPAV lesion is 90% stenosed.   Prox LAD to Mid LAD lesion is 50% stenosed.   1st Diag lesion is 50% stenosed.   The LAD is a large caliber vessel that courses to the apex. The stented segment of the mid LAD at the takeoff of the Diagonal branch is patent. The bifurcation stenting of the LAD and Diagonal is patent. There is moderate restenosis in the LAD and Diagonal stents but this does not appear to be flow limiting.    The Circumflex is a large dominant vessel. The proximal and mid Circumflex is patent with mild plaque disease. The stented segment of the distal AV groove Circumflex leading into the most distal obtuse marginal branch has severe stent restenosis throughout. There is also diffuse disease in the small caliber obtuse marginal branch beyond the stent. This is not favorable for PCI.    The RCA is a moderate caliber non-dominant vessel with chronic total occlusion of the proximal vessel. The mid and distal RCA fills from left to right collaterals.    She is known to have  paradoxical low flow/low gradient severe aortic stenosis by echo. Peak to peak gradient on cath today 10 mmHg.    Recommendations: Medical management of CAD. Smoking cessation. Continue TAVR workup.    Diagnostic Dominance: Co-dominant Left Anterior Descending  Prox LAD to Mid LAD lesion is 50% stenosed. The lesion was previously treated using a bare metal stent over 2 years ago.    First Diagonal Branch  1st Diag lesion is 50% stenosed. The lesion was previously treated using a bare metal stent over 2 years ago.    Left Circumflex    Third Left Posterolateral Branch  3rd LPL lesion is 70% stenosed.    Left Posterior Atrioventricular Artery  LPAV lesion is 90% stenosed. The lesion was previously treated using a stent (unknown type) over 2 years ago.    Right Coronary Artery  Vessel is moderate in size.  Prox RCA to Mid RCA lesion is 100% stenosed. The lesion is chronically occluded.    Right Posterior Descending Artery  Collaterals  RPDA filled by collaterals from 2nd Sept.      Intervention   No interventions have been documented.   Coronary Diagrams  Diagnostic Dominance: Co-dominant  Intervention   Implants   No implant documentation for this case.   Syngo Images   Show images for CARDIAC CATHETERIZATION Images on Long Term Storage   Show images for Cherron, Blitzer  to Procedure Log  Procedure Log    Hemo Data  Flowsheet Row Most Recent Value  Fick Cardiac Output 7.85 L/min  Fick Cardiac Output Index 3.82 (L/min)/BSA  Aortic Mean Gradient 0 mmHg  Aortic Peak Gradient 0 mmHg  Aortic Valve Area 1.64  Aortic Value Area Index 0.8 cm2/BSA  RA A Wave 12 mmHg  RA V Wave 8 mmHg  RA Mean 4 mmHg  RV Systolic Pressure 36 mmHg  RV Diastolic Pressure 2 mmHg  RV EDP 10 mmHg  PA Systolic Pressure 36 mmHg  PA Diastolic Pressure 8 mmHg  PA Mean 21 mmHg  PW A Wave 15 mmHg  PW V Wave 13 mmHg  PW Mean 7 mmHg  AO Systolic Pressure 140 mmHg  AO Diastolic  Pressure 64 mmHg  AO Mean 91 mmHg  LV Systolic Pressure 159 mmHg  LV Diastolic Pressure 9 mmHg  LV EDP 13 mmHg  AOp Systolic Pressure 146 mmHg  AOp Diastolic Pressure 68 mmHg  AOp Mean Pressure 97 mmHg  LVp Systolic Pressure 166 mmHg  LVp Diastolic Pressure 92 mmHg  LVp EDP Pressure 22 mmHg  QP/QS 1  TPVR Index 5.5 HRUI  TSVR Index 22.79 HRUI  PVR SVR Ratio 0.17  TPVR/TSVR Ratio 0.24    Recent Labs: No results found for requested labs within last 365 days.   Lipid Panel No results found for: CHOL, TRIG, HDL, CHOLHDL, VLDL, LDLCALC, LDLDIRECT   Wt Readings from Last 3 Encounters:  11/16/23 222 lb (100.7 kg)  05/28/23 223 lb 9.6 oz (101.4 kg)  02/12/23 228 lb 3.2 oz (103.5 kg)    Assessment and Plan:   1. Moderately Severe Aortic Valve Stenosis: Her most recent echo remains consistent with moderate aortic stenosis. NYHA class ***. She is at high risk for a pacemaker when we elect to proceed with TAVR given risk for high grade heart block following TAVR (baseline RBBB, LAFB and Mobitz 1 AV block). Her left main height is low and there is risk of occlusion of the left main with TAVR although her sinuses seem to be big enough that this may not be an issue.   I have reviewed the natural history of aortic stenosis with the patient and their family members  who are present today. We have discussed the limitations of medical therapy and the poor prognosis associated with symptomatic aortic stenosis. We have reviewed potential treatment options, including palliative medical therapy, conventional surgical aortic valve replacement, and transcatheter aortic valve replacement. We discussed treatment options in the context of the patient's specific comorbid medical conditions.   Follow aortic stenosis for now. Repeat echo in 6 months. .    2. CAD: No chest pain. CAD stable by cath in 2024. Continue ASA and statin.   3. HTN: BP is controlled. Continue Norvasc .   Labs/ tests  ordered today include:  No orders of the defined types were placed in this encounter.  Disposition:   F/U with me in 6 months.   Signed, Lonni Cash, MD, [facility] 06/07/2024 4:14 PM    [facility] [address] [place], [place], [place]  [address] Phone: [phone]; Fax: [phone]

## 2024-06-08 ENCOUNTER — Ambulatory Visit: Attending: Cardiovascular Disease | Admitting: Cardiovascular Disease

## 2024-06-08 ENCOUNTER — Encounter: Payer: Self-pay | Admitting: Cardiovascular Disease

## 2024-06-08 VITALS — BP 120/80 | HR 69 | Ht 65.0 in | Wt 212.4 lb

## 2024-06-08 DIAGNOSIS — I2510 Atherosclerotic heart disease of native coronary artery without angina pectoris: Secondary | ICD-10-CM | POA: Diagnosis not present

## 2024-06-08 DIAGNOSIS — I10 Essential (primary) hypertension: Secondary | ICD-10-CM | POA: Diagnosis not present

## 2024-06-08 DIAGNOSIS — I350 Nonrheumatic aortic (valve) stenosis: Secondary | ICD-10-CM

## 2024-06-08 NOTE — Patient Instructions (Signed)
 Medication Instructions:  Your physician recommends that you continue on your current medications as directed. Please refer to the Current Medication list given to you today.  *If you need a refill on your cardiac medications before your next appointment, please call your pharmacy*  Lab Work: None.  If you have labs (blood work) drawn today and your tests are completely normal, you will receive your results only by: MyChart Message (if you have MyChart) OR A paper copy in the mail If you have any lab test that is abnormal or we need to change your treatment, we will call you to review the results.  Testing/Procedures: Your physician has requested that you have an echocardiogram in 6 months (March 2026). Echocardiography is a painless test that uses sound waves to create images of your heart. It provides your doctor with information about the size and shape of your heart and how well your heart's chambers and valves are working. This procedure takes approximately one hour. There are no restrictions for this procedure. Please do NOT wear cologne, perfume, aftershave, or lotions (deodorant is allowed). Please arrive 15 minutes prior to your appointment time.  Please note: We ask at that you not bring children with you during ultrasound (echo/ vascular) testing. Due to room size and safety concerns, children are not allowed in the ultrasound rooms during exams. Our front office staff cannot provide observation of children in our lobby area while testing is being conducted. An adult accompanying a patient to their appointment will only be allowed in the ultrasound room at the discretion of the ultrasound technician under special circumstances. We apologize for any inconvenience.   Follow-Up: At [facility], you and your health needs are our priority.  As part of our continuing mission to provide you with exceptional heart care, our providers are all part of one team.  This team includes  your primary Cardiologist (physician) and Advanced Practice Providers or APPs (Physician Assistants and Nurse Practitioners) who all work together to provide you with the care you need, when you need it.  Your next appointment:   6 month(s)  Provider:   Dr. KYM Cash

## 2024-06-27 ENCOUNTER — Ambulatory Visit: Admitting: Physician Assistant

## 2024-08-16 ENCOUNTER — Ambulatory Visit
Admission: RE | Admit: 2024-08-16 | Discharge: 2024-08-16 | Disposition: A | Discharge status: Home / Self Care | Source: Ambulatory Visit | Attending: Medical | Admitting: Medical

## 2024-08-16 ENCOUNTER — Other Ambulatory Visit: Payer: Self-pay | Admitting: Medical

## 2024-08-16 DIAGNOSIS — M25552 Pain in left hip: Secondary | ICD-10-CM

## 2024-11-29 ENCOUNTER — Other Ambulatory Visit (HOSPITAL_COMMUNITY)

## 2024-12-02 ENCOUNTER — Ambulatory Visit: Admitting: Cardiovascular Disease
